# Patient Record
Sex: Female | Born: 1985 | Race: White | Hispanic: No | Marital: Single | Smoking: Never smoker
Health system: Southern US, Community
[De-identification: ages and names within clinical notes are randomized; demographics above are authoritative.]

## PROBLEM LIST (undated history)

## (undated) DIAGNOSIS — O009 Unspecified ectopic pregnancy without intrauterine pregnancy: Secondary | ICD-10-CM

## (undated) HISTORY — PX: MYRINGOTOMY WITH TUBE PLACEMENT: SHX5663

## (undated) HISTORY — PX: WISDOM TOOTH EXTRACTION: SHX21

## (undated) HISTORY — DX: Unspecified ectopic pregnancy without intrauterine pregnancy: O00.90

---

## 2011-03-12 DIAGNOSIS — O009 Unspecified ectopic pregnancy without intrauterine pregnancy: Secondary | ICD-10-CM

## 2011-03-12 HISTORY — DX: Unspecified ectopic pregnancy without intrauterine pregnancy: O00.90

## 2011-03-12 HISTORY — PX: OTHER SURGICAL HISTORY: SHX169

## 2011-08-31 ENCOUNTER — Inpatient Hospital Stay: Payer: Self-pay

## 2011-08-31 LAB — CBC WITH DIFFERENTIAL/PLATELET
Basophil #: 0.1 10*3/uL (ref 0.0–0.1)
Eosinophil #: 0 10*3/uL (ref 0.0–0.7)
Eosinophil %: 0.4 %
HCT: 33 % — ABNORMAL LOW (ref 35.0–47.0)
HGB: 11 g/dL — ABNORMAL LOW (ref 12.0–16.0)
MCH: 30.2 pg (ref 26.0–34.0)
MCHC: 33.5 g/dL (ref 32.0–36.0)
MCV: 90 fL (ref 80–100)
Monocyte %: 5.9 %
Neutrophil #: 8.7 10*3/uL — ABNORMAL HIGH (ref 1.4–6.5)
Neutrophil %: 77.9 %
Platelet: 257 10*3/uL (ref 150–440)
RBC: 3.66 10*6/uL — ABNORMAL LOW (ref 3.80–5.20)
RDW: 13.4 % (ref 11.5–14.5)
WBC: 11.2 10*3/uL — ABNORMAL HIGH (ref 3.6–11.0)

## 2011-09-01 LAB — HEMATOCRIT: HCT: 31.2 % — ABNORMAL LOW (ref 35.0–47.0)

## 2012-01-11 LAB — CBC
MCHC: 33.9 g/dL (ref 32.0–36.0)
MCV: 87 fL (ref 80–100)
Platelet: 425 10*3/uL (ref 150–440)
RBC: 4.39 10*6/uL (ref 3.80–5.20)
RDW: 13.2 % (ref 11.5–14.5)
WBC: 9.7 10*3/uL (ref 3.6–11.0)

## 2012-01-11 LAB — COMPREHENSIVE METABOLIC PANEL WITH GFR
Albumin: 3.7 g/dL
Alkaline Phosphatase: 94 U/L
Anion Gap: 8
BUN: 11 mg/dL
Bilirubin,Total: 0.2 mg/dL
Calcium, Total: 9.1 mg/dL
Chloride: 103 mmol/L
Co2: 28 mmol/L
Creatinine: 0.61 mg/dL
EGFR (African American): >60
EGFR (Non-African Amer.): >60
Glucose: 100 mg/dL — ABNORMAL HIGH
Osmolality: 277
Potassium: 4 mmol/L
SGOT(AST): 13 U/L — ABNORMAL LOW
SGPT (ALT): 20 U/L
Sodium: 139 mmol/L
Total Protein: 8.2 g/dL

## 2012-01-11 LAB — URINALYSIS, COMPLETE
Bacteria: NONE SEEN
Bilirubin,UR: NEGATIVE
Glucose,UR: NEGATIVE mg/dL (ref 0–75)
Ketone: NEGATIVE
Nitrite: NEGATIVE
Specific Gravity: 1.024 (ref 1.003–1.030)
Squamous Epithelial: 1

## 2012-01-11 LAB — HCG, QUANTITATIVE, PREGNANCY: Beta Hcg, Quant.: 9130 m[IU]/mL — ABNORMAL HIGH

## 2012-01-12 ENCOUNTER — Observation Stay: Payer: Self-pay | Admitting: Obstetrics & Gynecology

## 2013-12-21 ENCOUNTER — Inpatient Hospital Stay: Payer: Self-pay | Admitting: Obstetrics and Gynecology

## 2013-12-21 LAB — CBC WITH DIFFERENTIAL/PLATELET
Basophil #: 0 10*3/uL (ref 0.0–0.1)
Basophil %: 0.4 %
EOS PCT: 0.4 %
Eosinophil #: 0.1 10*3/uL (ref 0.0–0.7)
HCT: 36 % (ref 35.0–47.0)
HGB: 11.6 g/dL — ABNORMAL LOW (ref 12.0–16.0)
LYMPHS ABS: 2.1 10*3/uL (ref 1.0–3.6)
LYMPHS PCT: 18.5 %
MCH: 28.3 pg (ref 26.0–34.0)
MCHC: 32.3 g/dL (ref 32.0–36.0)
MCV: 88 fL (ref 80–100)
Monocyte #: 0.4 x10 3/mm (ref 0.2–0.9)
Monocyte %: 3.9 %
NEUTROS ABS: 8.8 10*3/uL — AB (ref 1.4–6.5)
NEUTROS PCT: 76.8 %
PLATELETS: 295 10*3/uL (ref 150–440)
RBC: 4.1 10*6/uL (ref 3.80–5.20)
RDW: 14 % (ref 11.5–14.5)
WBC: 11.4 10*3/uL — ABNORMAL HIGH (ref 3.6–11.0)

## 2013-12-21 LAB — PROTEIN / CREATININE RATIO, URINE
Creatinine, Urine: 158 mg/dL — ABNORMAL HIGH (ref 30.0–125.0)
PROTEIN, RANDOM URINE: 22 mg/dL — AB (ref 0–12)
Protein/Creat. Ratio: 139 mg/gCREAT (ref 0–200)

## 2013-12-22 LAB — COMPREHENSIVE METABOLIC PANEL
ALBUMIN: 2.3 g/dL — AB (ref 3.4–5.0)
AST: 20 U/L (ref 15–37)
Alkaline Phosphatase: 201 U/L — ABNORMAL HIGH
Anion Gap: 9 (ref 7–16)
BUN: 7 mg/dL (ref 7–18)
Bilirubin,Total: 0.2 mg/dL (ref 0.2–1.0)
Calcium, Total: 9 mg/dL (ref 8.5–10.1)
Chloride: 106 mmol/L (ref 98–107)
Co2: 26 mmol/L (ref 21–32)
Creatinine: 0.63 mg/dL (ref 0.60–1.30)
EGFR (African American): >60
EGFR (Non-African Amer.): >60
Glucose: 72 mg/dL (ref 65–99)
Osmolality: 278 (ref 275–301)
Potassium: 4.1 mmol/L (ref 3.5–5.1)
SGPT (ALT): 19 U/L
SODIUM: 141 mmol/L (ref 136–145)
Total Protein: 6.2 g/dL — ABNORMAL LOW (ref 6.4–8.2)

## 2013-12-23 LAB — HEMATOCRIT: HCT: 32.6 % — ABNORMAL LOW (ref 35.0–47.0)

## 2014-06-28 NOTE — Op Note (Signed)
PATIENT NAME:  Diana Bautista, Diana Bautista MR#:  [id number] DATE OF BIRTH:  09-23-1985  DATE OF PROCEDURE:  01/12/2012  PREOPERATIVE DIAGNOSIS: Left ectopic pregnancy.   POSTOPERATIVE DIAGNOSIS: Left ectopic pregnancy.   PROCEDURE PERFORMED: Operative laparoscopy with left salpingectomy.   SURGEON: Glean Salen, MD  ANESTHESIA: General.   ESTIMATED BLOOD LOSS: 50 mL.   COMPLICATIONS: None.   FINDINGS: Left ectopic pregnancy in the ampullary portion of fallopian tube that is damaged beyond repair for salpingostomy. A salpingectomy was performed. Normal right fallopian tube as well as normal uterus and ovaries.   DISPOSITION: To recovery room in stable condition.   TECHNIQUE: Patient is prepped and draped in the usual sterile fashion after adequate anesthesia is obtained in the dorsal lithotomy position. Sponge stick is placed per vagina for manipulation purposes and the bladder is drained with a Robinson catheter.   Attention is then turned to the abdomen where a Veress needle is inserted through a 5 mm infraumbilical incision after Marcaine is used to anesthetize the skin. Veress needle placement is confirmed using the hanging drop technique and the abdomen is then insufflated with CO2 gas. A 5 mm trocar is then inserted under direct visualization with the laparoscope with no injuries or bleeding noted. Patient is placed in Trendelenburg positioning and the above-mentioned findings are visualized.   A right lower quadrant 5 mm trocar and a suprapubic 11 mm trocar is placed for instrumentation purposes. The fallopian tube is noted to be significantly damaged and is excised using a 5 mm Harmonic scalpel with preservation of the blood supply to the ovary. It is placed in an Endopouch and removed. Excellent hemostasis noted. Pelvis is irrigated with aspiration of all fluid and blood clot. Gas is expelled and patient is leveled. Trocars are removed and skin is closed with Dermabond. Sponge stick is removed.  Patient goes to recovery in stable condition. All sponge, instrument, and needle counts are correct.   ____________________________ R. Barnett Applebaum, MD rph:cms D: 01/12/2012 19:17:23 ET T: 01/13/2012 09:51:06 ET JOB#: [id number]  cc: Glean Salen, MD, <Dictator>  Gae Dry MD ELECTRONICALLY SIGNED 01/14/2012 7:47

## 2014-07-19 NOTE — H&P (Signed)
L&D Evaluation:  History Expanded:   HPI 29 yo G1 whose EDC - 09/07/11.  Pt presents with SROM at 5:30 this am.    Blood Type A positive    Group B Strep Results (Result >5wks must be treated as unknown) negative    Maternal HIV Negative    Maternal Syphilis Ab Nonreactive    Maternal Varicella Immune    Rubella Results immune    Presents with leaking fluid    Patient's Medical History No Chronic Illness    Patient's Surgical History none    Medications Pre Natal Vitamins    Allergies PCN    Social History none   Exam:   Vital Signs stable    General no apparent distress    Chest clear    Heart normal sinus rhythm    Abdomen gravid, non-tender    Pelvic 2/70/-2    Mebranes Ruptured    Description clear    FHT normal rate with no decels   Impression:   Impression PPROM   Plan:   Comments Pt has been fully informed of the pros and cons, risk/benefits continued close observation versus the risks of induction. She understands that there are uncommon risks to induction, which include but are not limited to:  frequent and/or prolonged uterine contractions, fetal distress, uterine rupture and lack of success of induction.  She also has been informed that if the induction is not successful a Cesarean Section may be necessary.  All questions have been answered and she is in agreement with induction.   Electronic Signatures: Rosina Lowenstein (MD)  (Signed 22-Jun-13 09:39)  Authored: L&D Evaluation   Last Updated: 22-Jun-13 09:39 by Rosina Lowenstein (MD)

## 2015-10-10 ENCOUNTER — Other Ambulatory Visit: Payer: Self-pay | Admitting: Obstetrics and Gynecology

## 2015-10-10 DIAGNOSIS — M7989 Other specified soft tissue disorders: Secondary | ICD-10-CM

## 2015-10-25 ENCOUNTER — Ambulatory Visit: Payer: Self-pay

## 2015-11-08 ENCOUNTER — Other Ambulatory Visit: Payer: Self-pay | Admitting: Obstetrics and Gynecology

## 2015-11-08 ENCOUNTER — Ambulatory Visit
Admission: RE | Admit: 2015-11-08 | Discharge: 2015-11-08 | Disposition: A | Discharge status: Home / Self Care | Payer: Medicaid Other | Source: Ambulatory Visit | Attending: Obstetrics and Gynecology | Admitting: Obstetrics and Gynecology

## 2015-11-08 DIAGNOSIS — M7989 Other specified soft tissue disorders: Secondary | ICD-10-CM

## 2017-01-15 ENCOUNTER — Encounter: Payer: Self-pay | Admitting: Obstetrics and Gynecology

## 2017-01-15 ENCOUNTER — Ambulatory Visit (INDEPENDENT_AMBULATORY_CARE_PROVIDER_SITE_OTHER): Payer: Medicaid Other | Admitting: Obstetrics and Gynecology

## 2017-01-15 DIAGNOSIS — Z0000 Encounter for general adult medical examination without abnormal findings: Secondary | ICD-10-CM | POA: Diagnosis not present

## 2017-01-15 DIAGNOSIS — Z01419 Encounter for gynecological examination (general) (routine) without abnormal findings: Secondary | ICD-10-CM | POA: Diagnosis not present

## 2017-01-15 DIAGNOSIS — Z124 Encounter for screening for malignant neoplasm of cervix: Secondary | ICD-10-CM

## 2017-01-15 NOTE — Progress Notes (Signed)
Patient ID: Diana Bautista, female   DOB: Aug 28, 1985, 31 y.o.   MRN: [id number]     Gynecology Annual Exam  PCP: Patient, No Pcp Per  Chief Complaint:  Chief Complaint  Patient presents with  . Gynecologic Exam    History of Present Illness: Patient is a 31 y.o. [id number] presents for annual exam. The patient has no complaints today.   LMP: No LMP recorded. Patient is not currently having periods (Reason: IUD).  The patient is sexually active. She currently uses IUD for contraception. She denies dyspareunia.  The patient does perform self breast exams.  There is no notable family history of breast or ovarian cancer in her family.  The patient wears seatbelts: yes.   The patient has regular exercise: not asked.    The patient denies current symptoms of depression.    Review of Systems: Review of Systems  Constitutional: Negative for chills and fever.  HENT: Negative for congestion.   Respiratory: Negative for cough and shortness of breath.   Cardiovascular: Negative for chest pain and palpitations.  Gastrointestinal: Negative for abdominal pain, constipation, diarrhea, heartburn, nausea and vomiting.  Genitourinary: Negative for dysuria, frequency and urgency.  Skin: Negative for itching and rash.  Neurological: Negative for dizziness and headaches.  Endo/Heme/Allergies: Negative for polydipsia.  Psychiatric/Behavioral: Negative for depression.    Past Medical History:  Past Medical History:  Diagnosis Date  . Ectopic pregnancy 2013    Past Surgical History:  Past Surgical History:  Procedure Laterality Date  . left salpingectomy  2013   Ectopic pregnancy    Gynecologic History:  No LMP recorded. Patient is not currently having periods (Reason: IUD). Contraception: IUD 02/15/2014 Liletta Last Pap: Results were:10/09/2015 NIL and HR HPV negative   Obstetric History: [id number]  Family History:  Family History  Problem Relation Age of Onset  . Hodgkin's lymphoma  Paternal Grandmother     Social History:  Social History   Socioeconomic History  . Marital status: Single    Spouse name: Not on file  . Number of children: Not on file  . Years of education: Not on file  . Highest education level: Not on file  Social Needs  . Financial resource strain: Not on file  . Food insecurity - worry: Not on file  . Food insecurity - inability: Not on file  . Transportation needs - medical: Not on file  . Transportation needs - non-medical: Not on file  Occupational History  . Not on file  Tobacco Use  . Smoking status: Never Smoker  . Smokeless tobacco: Never Used  Substance and Sexual Activity  . Alcohol use: No    Frequency: Never  . Drug use: No  . Sexual activity: Yes    Partners: Male    Birth control/protection: IUD  Other Topics Concern  . Not on file  Social History Narrative  . Not on file    Allergies:  Allergies  Allergen Reactions  . Penicillins Hives    Medications: Prior to Admission medications   Medication Sig Start Date End Date Taking? Authorizing Provider  Levonorgestrel (LILETTA, 52 MG,) 19.5 MCG/DAY IUD by Intrauterine route.   Yes [provider]    Physical Exam Vitals: Blood pressure 130/82, pulse (!) 107, height 5\' 3"  (1.6 m), weight 273 lb (123.8 kg).  General: NAD HEENT: normocephalic, anicteric Thyroid: no enlargement, no palpable nodules Pulmonary: No increased work of breathing, CTAB Cardiovascular: RRR, distal pulses 2+ Breast: Breast symmetrical, no tenderness, no palpable nodules  or masses, no skin or nipple retraction present, no nipple discharge.  No axillary or supraclavicular lymphadenopathy. Abdomen: NABS, soft, non-tender, non-distended.  Umbilicus without lesions.  No hepatomegaly, splenomegaly or masses palpable. No evidence of hernia  Genitourinary:  External: Normal external female genitalia.  Normal urethral meatus, normal  Bartholin's and Skene's glands.    Vagina: Normal  vaginal mucosa, no evidence of prolapse.    Cervix: Grossly normal in appearance, no bleeding  Uterus: Non-enlarged, mobile, normal contour.  No CMT  Adnexa: ovaries non-enlarged, no adnexal masses  Rectal: deferred  Lymphatic: no evidence of inguinal lymphadenopathy Extremities: no edema, erythema, or tenderness Neurologic: Grossly intact Psychiatric: mood appropriate, affect full  Female chaperone present for pelvic and breast  portions of the physical exam    Assessment: 31 y.o. [id number] routine annual exam  Plan: Problem List Items Addressed This Visit    None    Visit Diagnoses    Screening for malignant neoplasm of cervix       Relevant Orders   PapIG, HPV, rfx 16/18   Encounter for gynecological examination without abnormal finding       Relevant Orders   PapIG, HPV, rfx 16/18      1) STI screening was not offered  2) ASCCP guidelines and rational discussed.  Patient opts for yearly screening interval  3) Contraception - Continue Liletta, discussed recent approval of 5 year duration  4) Routine healthcare maintenance including cholesterol, diabetes screening discussed Declines  5) Follow up 1 year for routine annual exam

## 2017-01-15 NOTE — Patient Instructions (Signed)
Preventive Care 18-39 Years, Female Preventive care refers to lifestyle choices and visits with your health care provider that can promote health and wellness. What does preventive care include?  A yearly physical exam. This is also called an annual well check.  Dental exams once or twice a year.  Routine eye exams. Ask your health care provider how often you should have your eyes checked.  Personal lifestyle choices, including: ? Daily care of your teeth and gums. ? Regular physical activity. ? Eating a healthy diet. ? Avoiding tobacco and drug use. ? Limiting alcohol use. ? Practicing safe sex. ? Taking vitamin and mineral supplements as recommended by your health care provider. What happens during an annual well check? The services and screenings done by your health care provider during your annual well check will depend on your age, overall health, lifestyle risk factors, and family history of disease. Counseling Your health care provider may ask you questions about your:  Alcohol use.  Tobacco use.  Drug use.  Emotional well-being.  Home and relationship well-being.  Sexual activity.  Eating habits.  Work and work Statistician.  Method of birth control.  Menstrual cycle.  Pregnancy history.  Screening You may have the following tests or measurements:  Height, weight, and BMI.  Diabetes screening. This is done by checking your blood sugar (glucose) after you have not eaten for a while (fasting).  Blood pressure.  Lipid and cholesterol levels. These may be checked every 5 years starting at age 66.  Skin check.  Hepatitis C blood test.  Hepatitis B blood test.  Sexually transmitted disease (STD) testing.  BRCA-related cancer screening. This may be done if you have a family history of breast, ovarian, tubal, or peritoneal cancers.  Pelvic exam and Pap test. This may be done every 3 years starting at age 40. Starting at age 59, this may be done every 5  years if you have a Pap test in combination with an HPV test.  Discuss your test results, treatment options, and if necessary, the need for more tests with your health care provider. Vaccines Your health care provider may recommend certain vaccines, such as:  Influenza vaccine. This is recommended every year.  Tetanus, diphtheria, and acellular pertussis (Tdap, Td) vaccine. You may need a Td booster every 10 years.  Varicella vaccine. You may need this if you have not been vaccinated.  HPV vaccine. If you are 69 or younger, you may need three doses over 6 months.  Measles, mumps, and rubella (MMR) vaccine. You may need at least one dose of MMR. You may also need a second dose.  Pneumococcal 13-valent conjugate (PCV13) vaccine. You may need this if you have certain conditions and were not previously vaccinated.  Pneumococcal polysaccharide (PPSV23) vaccine. You may need one or two doses if you smoke cigarettes or if you have certain conditions.  Meningococcal vaccine. One dose is recommended if you are age 27-21 years and a first-year college student living in a residence hall, or if you have one of several medical conditions. You may also need additional booster doses.  Hepatitis A vaccine. You may need this if you have certain conditions or if you travel or work in places where you may be exposed to hepatitis A.  Hepatitis B vaccine. You may need this if you have certain conditions or if you travel or work in places where you may be exposed to hepatitis B.  Haemophilus influenzae type b (Hib) vaccine. You may need this if  you have certain risk factors.  Talk to your health care provider about which screenings and vaccines you need and how often you need them. This information is not intended to replace advice given to you by your health care provider. Make sure you discuss any questions you have with your health care provider. Document Released: 04/23/2001 Document Revised: 11/15/2015  Document Reviewed: 12/27/2014 Elsevier Interactive Patient Education  2017 [employer].

## 2017-01-17 LAB — PAPIG, HPV, RFX 16/18
HPV, HIGH-RISK: NEGATIVE
PAP SMEAR COMMENT: 0

## 2017-05-05 DIAGNOSIS — J101 Influenza due to other identified influenza virus with other respiratory manifestations: Secondary | ICD-10-CM | POA: Diagnosis not present

## 2017-05-05 DIAGNOSIS — R509 Fever, unspecified: Secondary | ICD-10-CM | POA: Diagnosis not present

## 2017-11-17 DIAGNOSIS — J029 Acute pharyngitis, unspecified: Secondary | ICD-10-CM | POA: Diagnosis not present

## 2017-11-22 ENCOUNTER — Other Ambulatory Visit: Payer: Self-pay

## 2017-11-22 ENCOUNTER — Emergency Department
Admission: EM | Admit: 2017-11-22 | Discharge: 2017-11-22 | Disposition: A | Discharge status: Home / Self Care | Payer: Medicaid Other | Attending: Emergency Medicine | Admitting: Emergency Medicine

## 2017-11-22 ENCOUNTER — Encounter: Payer: Self-pay | Admitting: Physician Assistant

## 2017-11-22 DIAGNOSIS — B9789 Other viral agents as the cause of diseases classified elsewhere: Secondary | ICD-10-CM | POA: Diagnosis not present

## 2017-11-22 DIAGNOSIS — M26621 Arthralgia of right temporomandibular joint: Secondary | ICD-10-CM | POA: Insufficient documentation

## 2017-11-22 DIAGNOSIS — J029 Acute pharyngitis, unspecified: Secondary | ICD-10-CM | POA: Diagnosis not present

## 2017-11-22 DIAGNOSIS — J028 Acute pharyngitis due to other specified organisms: Secondary | ICD-10-CM

## 2017-11-22 LAB — GROUP A STREP BY PCR: Group A Strep by PCR: NOT DETECTED

## 2017-11-22 NOTE — ED Notes (Addendum)
Pt presents to ED via POV with c/o R sided facial pain x several days. Pt states initially started out as a sore throat then progressed to R sided facial pain, pt states pain to R cheek that radiates to her tongue, worse with tongue movement. Pt A&O x 4, NAD noted at this time. Facial symmetry intact.

## 2017-11-22 NOTE — ED Triage Notes (Signed)
Pt states has right sided facial pain. Pt states pain began with a sore throat, that has abated and is now localized to right sided jaw pain extending to right ear. Pt was swabbed for strep in triage. Tonsils are slightly enlarged.

## 2017-11-22 NOTE — Discharge Instructions (Addendum)
Your strep test was negative. Your exam is also, otherwise normal. Your symptoms may represent a mild TMJ dysfunction or dental pain due to jaw clinching. Consider a follow-up appointment with your dental provider. Take OTC Naproxen (550 mg) or ibuprofen (800 mg) for pain relief. Return to the Emergency Department as needed.

## 2017-11-24 NOTE — ED Provider Notes (Signed)
[facility] Emergency Department Provider Note ____________________________________________  Time seen: 2100  I have reviewed the triage vital signs and the nursing notes.  HISTORY  Chief Complaint  Facial Pain  HPI Diana Bautista is a 32 y.o. female who presents herself to the ED for evaluation of pain to the right face and jaw over the last week.  Patient describes pain began initially as a mild sore throat.  She describes a sore throat has now resolved and the pain is localized primarily to the right jaw.  She cannot pinpoint the exact location of the pain patient describes pressure and fullness to the jaw.  She does give a remote history of jaw clenching and teeth grinding.  She has also been evaluated by a local urgent care, but reports that they did not perform a strep test or any further evaluation.  She presents now with vague complaints of jaw and facial discomfort.  She denies any fevers, chills, sweats patient also denies any difficulty breathing, swallowing, or controlling secretions.  She denies any recent dental work or any focal dental abscess.  She denies any sinus congestion, nasal drainage, earache, or nasal allergies.  She has been taking over-the-counter pseudoephedrine with limited benefit.  Past Medical History:  Diagnosis Date  . Ectopic pregnancy 2013    There are no active problems to display for this patient.   Past Surgical History:  Procedure Laterality Date  . left salpingectomy  2013   Ectopic pregnancy    Prior to Admission medications   Medication Sig Start Date End Date Taking? Authorizing Provider  Levonorgestrel (LILETTA, 52 MG,) 19.5 MCG/DAY IUD by Intrauterine route.    [provider]    Allergies Penicillins  Family History  Problem Relation Age of Onset  . Hodgkin's lymphoma Paternal Grandmother     Social History Social History   Tobacco Use  . Smoking status: Never Smoker  . Smokeless tobacco:  Never Used  Substance Use Topics  . Alcohol use: No    Frequency: Never  . Drug use: No    Review of Systems  Constitutional: Negative for fever. Eyes: Negative for visual changes. ENT: Positive for sore throat.  Reports right facial pain as above. Cardiovascular: Negative for chest pain. Respiratory: Negative for shortness of breath. Gastrointestinal: Negative for abdominal pain, vomiting and diarrhea. Genitourinary: Negative for dysuria. Musculoskeletal: Negative for back pain. Skin: Negative for rash. Neurological: Negative for headaches, focal weakness or numbness. ____________________________________________  PHYSICAL EXAM:  VITAL SIGNS: ED Triage Vitals [11/22/17 2001]  Enc Vitals Group     BP (!) 164/84     Pulse Rate 93     Resp 16     Temp 98.3 F (36.8 C)     Temp Source Oral     SpO2 100 %     Weight 262 lb (118.8 kg)     Height 5\' 3"  (1.6 m)     Head Circumference      Peak Flow      Pain Score 5     Pain Loc      Pain Edu?      Excl. in [place]?     Constitutional: Alert and oriented. Well appearing and in no distress. Head: Normocephalic and atraumatic.  No obvious deformity, soft tissue swelling, or facial edema is appreciated.  Patient is nontender to palpation or percussion over the facial sinuses.  No TMJ dysfunction is appreciated.  No malocclusion of the jaw noted. Eyes: Conjunctivae are  normal. PERRL. Normal extraocular movements Ears: Canals clear. TMs intact bilaterally. Nose: No congestion/rhinorrhea/epistaxis. Mouth/Throat: Mucous membranes are moist.  Uvula is midline and tonsils are flat.  No oropharyngeal lesions are noted. Neck: Supple. No thyromegaly. Hematological/Lymphatic/Immunological: No cervical lymphadenopathy. Cardiovascular: Normal rate, regular rhythm. Normal distal pulses. Respiratory: Normal respiratory effort. No wheezes/rales/rhonchi. Musculoskeletal: Nontender with normal range of motion in all extremities.  Neurologic:   Normal gait without ataxia. Normal speech and language. No gross focal neurologic deficits are appreciated. Skin:  Skin is warm, dry and intact. No rash noted. Psychiatric: Mood and affect are normal. Patient exhibits appropriate insight and judgment. ____________________________________________   LABS (pertinent positives/negatives)  Labs Reviewed  GROUP A STREP BY PCR  ____________________________________________  PROCEDURES  Procedures ____________________________________________  INITIAL IMPRESSION / ASSESSMENT AND PLAN / ED COURSE  Patient with ED evaluation of intermittent sore throat as well as some discomfort to the right cheek and jaw.  Patient's exam is overall benign.  No focal dental abscess is noted.  She is reassured by her negative strep PCR test.  Patient symptoms may represent some TMJ dysfunction on the right.  Her sore throat symptoms while mild, and intermittent, may be viral and/or allergic in nature.  Patient is advised to follow with her dental provider for further evaluation and management. ____________________________________________  FINAL CLINICAL IMPRESSION(S) / ED DIAGNOSES  Final diagnoses:  Arthralgia of right temporomandibular joint  Sore throat (viral)      Carmie End, Dannielle Karvonen, PA-C 11/24/17 [place], [place], MD 11/28/17 2332

## 2017-12-05 ENCOUNTER — Other Ambulatory Visit (HOSPITAL_COMMUNITY)
Admission: RE | Admit: 2017-12-05 | Discharge: 2017-12-05 | Disposition: A | Discharge status: Home / Self Care | Payer: Medicaid Other | Source: Ambulatory Visit | Attending: Maternal Newborn | Admitting: Maternal Newborn

## 2017-12-05 ENCOUNTER — Ambulatory Visit: Payer: Medicaid Other | Admitting: Maternal Newborn

## 2017-12-05 ENCOUNTER — Encounter: Payer: Self-pay | Admitting: Maternal Newborn

## 2017-12-05 ENCOUNTER — Ambulatory Visit (INDEPENDENT_AMBULATORY_CARE_PROVIDER_SITE_OTHER): Payer: Medicaid Other | Admitting: Maternal Newborn

## 2017-12-05 VITALS — BP 124/86 | HR 105 | Ht 63.0 in | Wt 264.0 lb

## 2017-12-05 DIAGNOSIS — Z30431 Encounter for routine checking of intrauterine contraceptive device: Secondary | ICD-10-CM

## 2017-12-05 DIAGNOSIS — N898 Other specified noninflammatory disorders of vagina: Secondary | ICD-10-CM | POA: Insufficient documentation

## 2017-12-05 NOTE — Progress Notes (Signed)
Obstetrics & Gynecology Office Visit   Chief Complaint:  Chief Complaint  Patient presents with  . IUD check    no pain, just discomfort, feels like its swollen in the area x a few days    History of Present Illness: Diana Bautista reports vaginal/vulvar irritation and a feeling of external swelling for a couple of days. She does not report abnormal discharge or odor. She is concerned about whether her symptoms could be connected to her IUD and whether it is in the correct location.  Review of Systems: Review of systems negative unless otherwise noted in HPI.  Past Medical History:  Past Medical History:  Diagnosis Date  . Ectopic pregnancy 2013    Past Surgical History:  Past Surgical History:  Procedure Laterality Date  . left salpingectomy  2013   Ectopic pregnancy    Gynecologic History: No LMP recorded. (Menstrual status: IUD).  Obstetric History: [id number]  Family History:  Family History  Problem Relation Age of Onset  . Hodgkin's lymphoma Paternal Grandmother     Social History:  Social History   Socioeconomic History  . Marital status: Single    Spouse name: Not on file  . Number of children: Not on file  . Years of education: Not on file  . Highest education level: Not on file  Occupational History  . Not on file  Social Needs  . Financial resource strain: Not on file  . Food insecurity:    Worry: Not on file    Inability: Not on file  . Transportation needs:    Medical: Not on file    Non-medical: Not on file  Tobacco Use  . Smoking status: Never Smoker  . Smokeless tobacco: Never Used  Substance and Sexual Activity  . Alcohol use: No    Frequency: Never  . Drug use: No  . Sexual activity: Yes    Partners: Male    Birth control/protection: IUD    Comment: Lyletta  Lifestyle  . Physical activity:    Days per week: 7 days    Minutes per session: 30 min  . Stress: Not at all  Relationships  . Social connections:    Talks on phone: More than  three times a week    Gets together: Three times a week    Attends religious service: Never    Active member of club or organization: No    Attends meetings of clubs or organizations: Never    Relationship status: Never married  . Intimate partner violence:    Fear of current or ex partner: No    Emotionally abused: No    Physically abused: No    Forced sexual activity: No  Other Topics Concern  . Not on file  Social History Narrative  . Not on file    Allergies:  Allergies  Allergen Reactions  . Penicillins Hives    Medications: Prior to Admission medications   Medication Sig Start Date End Date Taking? Authorizing Provider  Levonorgestrel (LILETTA, 52 MG,) 19.5 MCG/DAY IUD by Intrauterine route.   Yes [provider]    Physical Exam Vitals:  Vitals:   12/05/17 1353  BP: 124/86  Pulse: (!) 105   No LMP recorded. (Menstrual status: IUD).  General: NAD HEENT: normocephalic, anicteric Pulmonary: No increased work of breathing Genitourinary:  External: Vulva appears irritated  Normal urethral meatus,  normal Bartholin's and Skene's glands.    Vagina: Normal vaginal mucosa, no evidence of prolapse.    Cervix: Grossly  normal in appearance, no bleeding, IUD  strings visible, white discharge present, small amount  Rectal: deferred  Lymphatic: no evidence of inguinal lymphadenopathy Extremities: no edema, erythema, or tenderness Neurologic: Grossly intact Psychiatric: mood appropriate, affect full  Assessment: 32 y.o. [id number] here for an IUD check and vaginal irritation.  Plan: Problem List Items Addressed This Visit    None    Visit Diagnoses    Vaginal irritation    -  Primary   Relevant Orders   Cervicovaginal ancillary only   Encounter for routine checking of intrauterine contraceptive device (IUD)         IUD appears to be in correct location, strings visualized. Swab sent for symptoms of irritation/swelling and will treat as needed when results  return.  Avel Sensor, CNM 12/05/2017  2:24 PM

## 2017-12-09 ENCOUNTER — Ambulatory Visit: Payer: Medicaid Other | Admitting: Obstetrics and Gynecology

## 2017-12-09 ENCOUNTER — Other Ambulatory Visit: Payer: Self-pay | Admitting: Maternal Newborn

## 2017-12-09 DIAGNOSIS — B373 Candidiasis of vulva and vagina: Secondary | ICD-10-CM

## 2017-12-09 DIAGNOSIS — B3731 Acute candidiasis of vulva and vagina: Secondary | ICD-10-CM

## 2017-12-09 DIAGNOSIS — N760 Acute vaginitis: Secondary | ICD-10-CM

## 2017-12-09 DIAGNOSIS — B9689 Other specified bacterial agents as the cause of diseases classified elsewhere: Secondary | ICD-10-CM

## 2017-12-09 LAB — CERVICOVAGINAL ANCILLARY ONLY
BACTERIAL VAGINITIS: POSITIVE — AB
Candida vaginitis: POSITIVE — AB

## 2017-12-09 MED ORDER — FLUCONAZOLE 150 MG PO TABS: 150.0000 mg | ORAL_TABLET | Freq: Once | ORAL | 0 refills | Status: AC

## 2017-12-09 MED ORDER — SECNIDAZOLE 2 G PO PACK: 1.0000 | PACK | Freq: Once | ORAL | 0 refills | Status: AC

## 2017-12-09 NOTE — Progress Notes (Signed)
Sent Rx for Diflucan to treat yeast infection and Solosec for BV. She will call for alternative (Flagyl) if Solosec is not affordable.

## 2017-12-11 ENCOUNTER — Other Ambulatory Visit: Payer: Self-pay | Admitting: Maternal Newborn

## 2017-12-11 ENCOUNTER — Telehealth: Payer: Self-pay

## 2017-12-11 DIAGNOSIS — N760 Acute vaginitis: Principal | ICD-10-CM

## 2017-12-11 DIAGNOSIS — B9689 Other specified bacterial agents as the cause of diseases classified elsewhere: Secondary | ICD-10-CM

## 2017-12-11 MED ORDER — METRONIDAZOLE 500 MG PO TABS: 500.0000 mg | ORAL_TABLET | Freq: Two times a day (BID) | ORAL | 0 refills | Status: AC

## 2017-12-11 NOTE — Telephone Encounter (Signed)
Pt was seen last Fri.; one of the medications rx'd wasn't approved which [facility] said it might not be and if so to call and she would call in an alternative.  [phone]

## 2017-12-11 NOTE — Progress Notes (Signed)
RX for Flagyl as Solosec not affordable.

## 2017-12-11 NOTE — Telephone Encounter (Signed)
Pt has already picked it up

## 2017-12-11 NOTE — Telephone Encounter (Signed)
Flagyl sent to pharmacy, please let me know if she has any more problems with Rx. Thanks.

## 2017-12-12 ENCOUNTER — Encounter: Payer: Self-pay | Admitting: Maternal Newborn

## 2018-03-23 DIAGNOSIS — J069 Acute upper respiratory infection, unspecified: Secondary | ICD-10-CM | POA: Diagnosis not present

## 2018-05-11 ENCOUNTER — Ambulatory Visit: Payer: Medicaid Other | Admitting: Obstetrics and Gynecology

## 2018-05-18 NOTE — Progress Notes (Signed)
PCP:  [facility]   Chief Complaint  Patient presents with  . Gynecologic Exam     HPI:      Ms. Diana Bautista is a 33 y.o. [id number] who LMP was No LMP recorded. (Menstrual status: IUD)., presents today for her annual examination.  Her menses are light bleeding with wiping only about once a month, with IUD. Dysmenorrhea none. She does not have intermenstrual bleeding.  Sex activity: single partner, contraception - IUD. Liletta placed 02/15/14. Last Pap: January 15, 2017  Results were: no abnormalities /neg HPV DNA. Likes yearly paps, no hx of abn. Hx of STDs: none  There is no FH of breast cancer. There is no FH of ovarian cancer. The patient does do self-breast exams. Hx of fatty tissue LT axilla 2017 on mammo/u/s.  Tobacco use: The patient denies current or previous tobacco use. Alcohol use: none No drug use.  Exercise: min active  She does get adequate calcium but Vitamin D in her diet. Seeing new PCP in a couple wks, will do labs there.  Past Medical History:  Diagnosis Date  . Ectopic pregnancy 2013    Past Surgical History:  Procedure Laterality Date  . left salpingectomy  2013   Ectopic pregnancy    Family History  Problem Relation Age of Onset  . Hodgkin's lymphoma Paternal Grandmother   . Breast cancer Neg Hx   . Ovarian cancer Neg Hx     Social History   Socioeconomic History  . Marital status: Single    Spouse name: Not on file  . Number of children: Not on file  . Years of education: Not on file  . Highest education level: Not on file  Occupational History  . Not on file  Social Needs  . Financial resource strain: Not on file  . Food insecurity:    Worry: Not on file    Inability: Not on file  . Transportation needs:    Medical: Not on file    Non-medical: Not on file  Tobacco Use  . Smoking status: Never Smoker  . Smokeless tobacco: Never Used  Substance and Sexual Activity  . Alcohol use: No    Frequency: Never  . Drug  use: No  . Sexual activity: Yes    Partners: Male    Birth control/protection: I.U.D.    Comment: Lyletta  Lifestyle  . Physical activity:    Days per week: 7 days    Minutes per session: 30 min  . Stress: Not at all  Relationships  . Social connections:    Talks on phone: More than three times a week    Gets together: Three times a week    Attends religious service: Never    Active member of club or organization: No    Attends meetings of clubs or organizations: Never    Relationship status: Never married  . Intimate partner violence:    Fear of current or ex partner: No    Emotionally abused: No    Physically abused: No    Forced sexual activity: No  Other Topics Concern  . Not on file  Social History Narrative  . Not on file    Outpatient Medications Prior to Visit  Medication Sig Dispense Refill  . ibuprofen (ADVIL,MOTRIN) 800 MG tablet TK 1 T PO Q 6 TO 8 H PRN FOR PAIN    . Levonorgestrel (LILETTA, 52 MG,) 19.5 MCG/DAY IUD by Intrauterine route.     No facility-administered  medications prior to visit.    ROS:  Review of Systems  Constitutional: Negative for fatigue, fever and unexpected weight change.  Respiratory: Negative for cough, shortness of breath and wheezing.   Cardiovascular: Negative for chest pain, palpitations and leg swelling.  Gastrointestinal: Negative for blood in stool, constipation, diarrhea, nausea and vomiting.  Endocrine: Negative for cold intolerance, heat intolerance and polyuria.  Genitourinary: Negative for dyspareunia, dysuria, flank pain, frequency, genital sores, hematuria, menstrual problem, pelvic pain, urgency, vaginal bleeding, vaginal discharge and vaginal pain.  Musculoskeletal: Negative for back pain, joint swelling and myalgias.  Skin: Negative for rash.  Neurological: Positive for headaches. Negative for dizziness, syncope, light-headedness and numbness.  Hematological: Negative for adenopathy.  Psychiatric/Behavioral:  Negative for agitation, confusion, sleep disturbance and suicidal ideas. The patient is not nervous/anxious.    BREAST: No symptoms   Objective: BP 120/80   Pulse 90   Ht 5\' 3"  (1.6 m)   Wt 277 lb (125.6 kg)   BMI 49.07 kg/m    Physical Exam Constitutional:      Appearance: She is well-developed.  Genitourinary:     Vulva, vagina, cervix, uterus, right adnexa and left adnexa normal.     No vulval lesion or tenderness noted.     No vaginal discharge, erythema or tenderness.     No cervical polyp.     IUD strings visualized.     Uterus is not enlarged or tender.     No right or left adnexal mass present.     Right adnexa not tender.     Left adnexa not tender.     Genitourinary Comments: IUD STRINGS IN CX OS  Neck:     Musculoskeletal: Normal range of motion.     Thyroid: No thyromegaly.  Cardiovascular:     Rate and Rhythm: Normal rate and regular rhythm.     Heart sounds: Normal heart sounds. No murmur.  Pulmonary:     Effort: Pulmonary effort is normal.     Breath sounds: Normal breath sounds.  Chest:     Breasts:        Right: No mass, nipple discharge, skin change or tenderness.        Left: No mass, nipple discharge, skin change or tenderness.  Abdominal:     Palpations: Abdomen is soft.     Tenderness: There is no abdominal tenderness. There is no guarding.  Musculoskeletal: Normal range of motion.  Neurological:     General: No focal deficit present.     Mental Status: She is alert and oriented to person, place, and time.     Cranial Nerves: No cranial nerve deficit.  Skin:    General: Skin is warm and dry.  Psychiatric:        Mood and Affect: Mood normal.        Behavior: Behavior normal.        Thought Content: Thought content normal.        Judgment: Judgment normal.  Vitals signs reviewed.     Assessment/Plan: Encounter for annual routine gynecological examination  Cervical cancer screening - Plan: Cytology - PAP  Encounter for routine  checking of intrauterine contraceptive device (IUD) - IUD in place. Liletta indication is for 6 yrs now. F/u next yr.           GYN counsel adequate intake of calcium and vitamin D, diet and exercise     F/U  Return in about 1 year (around 05/19/2019).  Natali Lavallee B. Yitzhak Awan, PA-C  05/19/2018 8:37 AM

## 2018-05-18 NOTE — Patient Instructions (Signed)
I value your feedback and entrusting us with your care. If you get a [facility] patient survey, I would appreciate you taking the time to let us know about your experience today. Thank you! 

## 2018-05-19 ENCOUNTER — Encounter: Payer: Self-pay | Admitting: Obstetrics and Gynecology

## 2018-05-19 ENCOUNTER — Other Ambulatory Visit (HOSPITAL_COMMUNITY)
Admission: RE | Admit: 2018-05-19 | Discharge: 2018-05-19 | Disposition: A | Discharge status: Home / Self Care | Payer: Medicaid Other | Source: Ambulatory Visit | Attending: Obstetrics and Gynecology | Admitting: Obstetrics and Gynecology

## 2018-05-19 ENCOUNTER — Ambulatory Visit (INDEPENDENT_AMBULATORY_CARE_PROVIDER_SITE_OTHER): Payer: Medicaid Other | Admitting: Obstetrics and Gynecology

## 2018-05-19 VITALS — BP 120/80 | HR 90 | Ht 63.0 in | Wt 277.0 lb

## 2018-05-19 DIAGNOSIS — Z124 Encounter for screening for malignant neoplasm of cervix: Secondary | ICD-10-CM | POA: Insufficient documentation

## 2018-05-19 DIAGNOSIS — Z0000 Encounter for general adult medical examination without abnormal findings: Secondary | ICD-10-CM

## 2018-05-19 DIAGNOSIS — Z30431 Encounter for routine checking of intrauterine contraceptive device: Secondary | ICD-10-CM

## 2018-05-19 DIAGNOSIS — Z01419 Encounter for gynecological examination (general) (routine) without abnormal findings: Secondary | ICD-10-CM

## 2018-05-20 ENCOUNTER — Encounter: Payer: Self-pay | Admitting: Obstetrics and Gynecology

## 2018-05-20 LAB — CYTOLOGY - PAP: Diagnosis: NEGATIVE

## 2018-06-02 ENCOUNTER — Ambulatory Visit: Payer: Self-pay | Admitting: Physician Assistant

## 2018-11-12 ENCOUNTER — Encounter: Payer: Self-pay | Admitting: Physician Assistant

## 2018-11-12 ENCOUNTER — Ambulatory Visit (INDEPENDENT_AMBULATORY_CARE_PROVIDER_SITE_OTHER): Payer: Medicaid Other | Admitting: Physician Assistant

## 2018-11-12 ENCOUNTER — Other Ambulatory Visit: Payer: Self-pay

## 2018-11-12 VITALS — BP 136/89 | HR 102 | Temp 97.1°F | Ht 64.0 in | Wt 275.0 lb

## 2018-11-12 DIAGNOSIS — Z114 Encounter for screening for human immunodeficiency virus [HIV]: Secondary | ICD-10-CM

## 2018-11-12 DIAGNOSIS — B373 Candidiasis of vulva and vagina: Secondary | ICD-10-CM

## 2018-11-12 DIAGNOSIS — R0989 Other specified symptoms and signs involving the circulatory and respiratory systems: Secondary | ICD-10-CM

## 2018-11-12 DIAGNOSIS — Z862 Personal history of diseases of the blood and blood-forming organs and certain disorders involving the immune mechanism: Secondary | ICD-10-CM

## 2018-11-12 DIAGNOSIS — L989 Disorder of the skin and subcutaneous tissue, unspecified: Secondary | ICD-10-CM

## 2018-11-12 DIAGNOSIS — R198 Other specified symptoms and signs involving the digestive system and abdomen: Secondary | ICD-10-CM

## 2018-11-12 DIAGNOSIS — R09A2 Foreign body sensation, throat: Secondary | ICD-10-CM

## 2018-11-12 DIAGNOSIS — B3731 Acute candidiasis of vulva and vagina: Secondary | ICD-10-CM

## 2018-11-12 MED ORDER — FLUCONAZOLE 150 MG PO TABS: ORAL_TABLET | ORAL | 0 refills | Status: DC

## 2018-11-12 MED ORDER — FAMOTIDINE 20 MG PO TABS: 20.0000 mg | ORAL_TABLET | Freq: Every day | ORAL | 2 refills | Status: DC

## 2018-11-12 NOTE — Patient Instructions (Signed)
Pepcid 20 mg 30 min before a meal on an empty stomach    Health Maintenance, Female Adopting a healthy lifestyle and getting preventive care are important in promoting health and wellness. Ask your health care provider about:  The right schedule for you to have regular tests and exams.  Things you can do on your own to prevent diseases and keep yourself healthy. What should I know about diet, weight, and exercise? Eat a healthy diet   Eat a diet that includes plenty of vegetables, fruits, low-fat dairy products, and lean protein.  Do not eat a lot of foods that are high in solid fats, added sugars, or sodium. Maintain a healthy weight Body mass index (BMI) is used to identify weight problems. It estimates body fat based on height and weight. Your health care provider can help determine your BMI and help you achieve or maintain a healthy weight. Get regular exercise Get regular exercise. This is one of the most important things you can do for your health. Most adults should:  Exercise for at least 150 minutes each week. The exercise should increase your heart rate and make you sweat (moderate-intensity exercise).  Do strengthening exercises at least twice a week. This is in addition to the moderate-intensity exercise.  Spend less time sitting. Even light physical activity can be beneficial. Watch cholesterol and blood lipids Have your blood tested for lipids and cholesterol at 35 years of age, then have this test every 5 years. Have your cholesterol levels checked more often if:  Your lipid or cholesterol levels are high.  You are older than 35 years of age.  You are at high risk for heart disease. What should I know about cancer screening? Depending on your health history and family history, you may need to have cancer screening at various ages. This may include screening for:  Breast cancer.  Cervical cancer.  Colorectal cancer.  Skin cancer.  Lung cancer. What should  I know about heart disease, diabetes, and high blood pressure? Blood pressure and heart disease  High blood pressure causes heart disease and increases the risk of stroke. This is more likely to develop in people who have high blood pressure readings, are of African descent, or are overweight.  Have your blood pressure checked: ? Every 3-5 years if you are 90-13 years of age. ? Every year if you are 80 years old or older. Diabetes Have regular diabetes screenings. This checks your fasting blood sugar level. Have the screening done:  Once every three years after age 66 if you are at a normal weight and have a low risk for diabetes.  More often and at a younger age if you are overweight or have a high risk for diabetes. What should I know about preventing infection? Hepatitis B If you have a higher risk for hepatitis B, you should be screened for this virus. Talk with your health care provider to find out if you are at risk for hepatitis B infection. Hepatitis C Testing is recommended for:  Everyone born from [birth year] through 1967.  Anyone with known risk factors for hepatitis C. Sexually transmitted infections (STIs)  Get screened for STIs, including gonorrhea and chlamydia, if: ? You are sexually active and are younger than 35 years of age. ? You are older than 35 years of age and your health care provider tells you that you are at risk for this type of infection. ? Your sexual activity has changed since you were last screened, and  you are at increased risk for chlamydia or gonorrhea. Ask your health care provider if you are at risk.  Ask your health care provider about whether you are at high risk for HIV. Your health care provider may recommend a prescription medicine to help prevent HIV infection. If you choose to take medicine to prevent HIV, you should first get tested for HIV. You should then be tested every 3 months for as long as you are taking the medicine. Pregnancy  If you are  about to stop having your period (premenopausal) and you may become pregnant, seek counseling before you get pregnant.  Take 400 to 800 micrograms (mcg) of folic acid every day if you become pregnant.  Ask for birth control (contraception) if you want to prevent pregnancy. Osteoporosis and menopause Osteoporosis is a disease in which the bones lose minerals and strength with aging. This can result in bone fractures. If you are 59 years old or older, or if you are at risk for osteoporosis and fractures, ask your health care provider if you should:  Be screened for bone loss.  Take a calcium or vitamin D supplement to lower your risk of fractures.  Be given hormone replacement therapy (HRT) to treat symptoms of menopause. Follow these instructions at home: Lifestyle  Do not use any products that contain nicotine or tobacco, such as cigarettes, e-cigarettes, and chewing tobacco. If you need help quitting, ask your health care provider.  Do not use street drugs.  Do not share needles.  Ask your health care provider for help if you need support or information about quitting drugs. Alcohol use  Do not drink alcohol if: ? Your health care provider tells you not to drink. ? You are pregnant, may be pregnant, or are planning to become pregnant.  If you drink alcohol: ? Limit how much you use to 0-1 drink a day. ? Limit intake if you are breastfeeding.  Be aware of how much alcohol is in your drink. In the [place] one drink equals one 12 oz bottle of beer (355 mL), one 5 oz glass of wine (148 mL), or one 1 oz glass of hard liquor (44 mL). General instructions  Schedule regular health, dental, and eye exams.  Stay current with your vaccines.  Tell your health care provider if: ? You often feel depressed. ? You have ever been abused or do not feel safe at home. Summary  Adopting a healthy lifestyle and getting preventive care are important in promoting health and wellness.  Follow  your health care provider's instructions about healthy diet, exercising, and getting tested or screened for diseases.  Follow your health care provider's instructions on monitoring your cholesterol and blood pressure. This information is not intended to replace advice given to you by your health care provider. Make sure you discuss any questions you have with your health care provider. Document Released: 09/10/2010 Document Revised: 02/18/2018 Document Reviewed: 02/18/2018 Elsevier Patient Education  2020 [employer].

## 2018-11-12 NOTE — Progress Notes (Signed)
Patient: Diana Bautista Female    DOB: Dec 28, 1985   33 y.o.   MRN: [id number] Visit Date: 11/12/2018  Today's Provider: Trinna Post, PA-C   Chief Complaint  Patient presents with  . Establish Care   Subjective:     HPI   Living in [place], [place] - two children ages 57 and 21. Works as a stay at home. No health conditions.   IUD - no periods.  Last PAP 05/19/2018: Normal  Smoking, drinking, drugs: none.   Would like a referral to dermatology for two skin lesions. These have been present for years and would like to see specialist about this. One lesion is on her left ankle and then also discoloration on top of her ear concerns her.   Pt also reports feeling a "lump" in her throat.  She has a sensation of a lump in her throat. Makes her feel anxious. Does not choke. Denies history dysphagia, sore throat. Denies history of thyroid issues. Can go away with distraction   Morbid Obesity: Reports significant weight gain with her son and she gained 35 pounds during that pregnancy. After she had him and quit job and became a stay at home mom, links this to weight gain. At that time she gained weight after delivery. Son with autism, which serves as a stressor for her. Second pregnancy resulted in miscarriage. Third pregnancy, gained 20 lbs. Has decreased sugar, fried foods, mostly vegetarian. Patient voices concern about diabetes. Lost 10 pounds in the last month. Doesn't drink soft drinks, just coffee and almond milk without sugar. Does have a sweet teeth. Does walk with kids but feels she could do more. [place]   Wt Readings from Last 3 Encounters:  11/12/18 275 lb (124.7 kg)  05/19/18 277 lb (125.6 kg)  12/05/17 264 lb (119.7 kg)   BMI Readings from Last 5 Encounters:  11/12/18 47.20 kg/m  05/19/18 49.07 kg/m  12/05/17 46.77 kg/m  11/22/17 46.41 kg/m  01/15/17 48.36 kg/m   Patient reports vaginal yeast infection treated last year. Since that time she has had some itching.  PAP smear on 05/19/2018 showed candida.   Allergies  Allergen Reactions  . Penicillins Hives     Current Outpatient Medications:  .  ibuprofen (ADVIL,MOTRIN) 800 MG tablet, TK 1 T PO Q 6 TO 8 H PRN FOR PAIN, Disp: , Rfl:  .  Levonorgestrel (LILETTA, 52 MG,) 19.5 MCG/DAY IUD, by Intrauterine route., Disp: , Rfl:  .  famotidine (PEPCID) 20 MG tablet, Take 1 tablet (20 mg total) by mouth daily., Disp: 30 tablet, Rfl: 2  Review of Systems  Constitutional: Negative.   HENT: Positive for dental problem.   Eyes: Negative.   Respiratory: Negative.   Cardiovascular: Negative.   Gastrointestinal: Negative.   Endocrine: Negative.   Genitourinary: Negative.   Musculoskeletal: Negative.   Skin: Negative.   Allergic/Immunologic: Negative.   Neurological: Negative.   Hematological: Negative.   Psychiatric/Behavioral: Negative.     Social History   Tobacco Use  . Smoking status: Never Smoker  . Smokeless tobacco: Never Used  Substance Use Topics  . Alcohol use: No    Frequency: Never      Objective:   BP 136/89 (BP Location: Right Arm, Patient Position: Sitting, Cuff Size: Large)   Pulse (!) 102   Temp (!) 97.1 F (36.2 C) (Temporal)   Ht 5\' 4"  (1.626 m)   Wt 275 lb (124.7 kg)   BMI 47.20 kg/m  Vitals:   11/12/18 1051  BP: 136/89  Pulse: (!) 102  Temp: (!) 97.1 F (36.2 C)  TempSrc: Temporal  Weight: 275 lb (124.7 kg)  Height: 5\' 4"  (1.626 m)  Body mass index is 47.2 kg/m.   Physical Exam Constitutional:      Appearance: Normal appearance. She is obese.  HENT:     Right Ear: Tympanic membrane and ear canal normal.     Left Ear: Tympanic membrane and ear canal normal.     Mouth/Throat:     Mouth: Mucous membranes are moist.     Pharynx: Oropharynx is clear. No oropharyngeal exudate or posterior oropharyngeal erythema.  Eyes:     Conjunctiva/sclera: Conjunctivae normal.  Neck:     Musculoskeletal: Neck supple. No neck rigidity.     Thyroid: Thyromegaly  present.  Cardiovascular:     Rate and Rhythm: Normal rate and regular rhythm.     Heart sounds: Normal heart sounds.  Pulmonary:     Effort: Pulmonary effort is normal.     Breath sounds: Normal breath sounds.  Abdominal:     General: Bowel sounds are normal.     Palpations: Abdomen is soft.  Skin:    General: Skin is warm and dry.  Neurological:     Mental Status: She is alert and oriented to person, place, and time. Mental status is at baseline.  Psychiatric:        Mood and Affect: Mood normal.        Behavior: Behavior normal.      No results found for any visits on 11/12/18.     Assessment & Plan    1. Skin lesions  Patient asks about specific tests for cancer. Counseled this is not indicated at this time. May pursue HPV vaccine, check with insurance about coverage.   - Ambulatory referral to Dermatology  2. Globus sensation  Counseled on various causes including PND and GERD. Can also be due to anxiety sensation, which I think may be the case in this particular situation.   - famotidine (PEPCID) 20 MG tablet; Take 1 tablet (20 mg total) by mouth daily.  Dispense: 30 tablet; Refill: 2  3. Morbid obesity (HCC)  - Comprehensive Metabolic Panel (CMET) - CBC with Differential - TSH - Lipid Profile  4. History of anemia   5. Encounter for screening for HIV  - HIV antibody (with reflex)  6. Vaginal candida  - fluconazole (DIFLUCAN) 150 MG tablet; Take 1 tablet on day 1. Then second tablet 3 days later if still having symptoms.  Dispense: 2 tablet; Refill: 0  The entirety of the information documented in the History of Present Illness, Review of Systems and Physical Exam were personally obtained by me. Portions of this information were initially documented by Ashley Royalty, CMA and reviewed by me for thoroughness and accuracy.   F/u 1 year     Trinna Post, PA-C  [facility]

## 2018-11-13 ENCOUNTER — Encounter: Payer: Self-pay | Admitting: Physician Assistant

## 2018-11-13 ENCOUNTER — Telehealth: Payer: Self-pay

## 2018-11-13 LAB — CBC WITH DIFFERENTIAL/PLATELET
Basophils Absolute: 0 10*3/uL (ref 0.0–0.2)
Basos: 0 %
EOS (ABSOLUTE): 0.1 10*3/uL (ref 0.0–0.4)
Eos: 1 %
Hematocrit: 40.8 % (ref 34.0–46.6)
Hemoglobin: 13.8 g/dL (ref 11.1–15.9)
Immature Grans (Abs): 0 10*3/uL (ref 0.0–0.1)
Immature Granulocytes: 0 %
Lymphocytes Absolute: 2.2 10*3/uL (ref 0.7–3.1)
Lymphs: 32 %
MCH: 29.7 pg (ref 26.6–33.0)
MCHC: 33.8 g/dL (ref 31.5–35.7)
MCV: 88 fL (ref 79–97)
Monocytes Absolute: 0.5 10*3/uL (ref 0.1–0.9)
Monocytes: 7 %
Neutrophils Absolute: 4.1 10*3/uL (ref 1.4–7.0)
Neutrophils: 60 %
Platelets: 408 10*3/uL (ref 150–450)
RBC: 4.65 x10E6/uL (ref 3.77–5.28)
RDW: 12.9 % (ref 11.7–15.4)
WBC: 6.9 10*3/uL (ref 3.4–10.8)

## 2018-11-13 LAB — LIPID PANEL
Chol/HDL Ratio: 3.7 ratio (ref 0.0–4.4)
Cholesterol, Total: 218 mg/dL — ABNORMAL HIGH (ref 100–199)
HDL: 59 mg/dL (ref >39)
LDL Chol Calc (NIH): 146 mg/dL — ABNORMAL HIGH (ref 0–99)
Triglycerides: 74 mg/dL (ref 0–149)
VLDL Cholesterol Cal: 13 mg/dL (ref 5–40)

## 2018-11-13 LAB — COMPREHENSIVE METABOLIC PANEL
ALT: 22 IU/L (ref 0–32)
AST: 18 IU/L (ref 0–40)
Albumin/Globulin Ratio: 1.4 (ref 1.2–2.2)
Albumin: 4.3 g/dL (ref 3.8–4.8)
Alkaline Phosphatase: 70 IU/L (ref 39–117)
BUN/Creatinine Ratio: 11 (ref 9–23)
BUN: 9 mg/dL (ref 6–20)
Bilirubin Total: 0.4 mg/dL (ref 0.0–1.2)
CO2: 26 mmol/L (ref 20–29)
Calcium: 10.6 mg/dL — ABNORMAL HIGH (ref 8.7–10.2)
Chloride: 100 mmol/L (ref 96–106)
Creatinine, Ser: 0.79 mg/dL (ref 0.57–1.00)
GFR calc Af Amer: 113 mL/min/{1.73_m2} (ref >59)
GFR calc non Af Amer: 98 mL/min/{1.73_m2} (ref >59)
Globulin, Total: 3 g/dL (ref 1.5–4.5)
Glucose: 95 mg/dL (ref 65–99)
Potassium: 4.1 mmol/L (ref 3.5–5.2)
Sodium: 140 mmol/L (ref 134–144)
Total Protein: 7.3 g/dL (ref 6.0–8.5)

## 2018-11-13 LAB — TSH: TSH: 3.17 u[IU]/mL (ref 0.450–4.500)

## 2018-11-13 LAB — HIV ANTIBODY (ROUTINE TESTING W REFLEX): HIV Screen 4th Generation wRfx: NONREACTIVE

## 2018-11-13 NOTE — Telephone Encounter (Signed)
Pt advised.   Thanks,   -Laura  

## 2018-11-13 NOTE — Telephone Encounter (Signed)
-----   Message from Mar Daring, [place] sent at 11/13/2018 10:44 AM EDT ----- Kidney and liver function are normal. Sugar is normal. Sodium and potassium are normal. Calcium is borderline high. Stop any calcium supplements and limit calcium rich foods from diet. Recheck in 4 weeks with PTH and intact calcium please. Blood count is normal. Thyroid is normal. Cholesterol is elevated. Recommend limiting fatty foods, red meats, processed foods in diet. Can recheck in 6 months. If still elevated may need to consider cholesterol lowering medications. HIV screen done once in lifetime, unless exposed, is negative.

## 2018-11-17 NOTE — Telephone Encounter (Signed)
Please review for Adriana.  Thanks,   -Mickel Baas

## 2018-11-18 ENCOUNTER — Other Ambulatory Visit: Payer: Self-pay | Admitting: Physician Assistant

## 2019-01-06 ENCOUNTER — Other Ambulatory Visit: Payer: Self-pay

## 2019-01-07 DIAGNOSIS — L608 Other nail disorders: Secondary | ICD-10-CM | POA: Diagnosis not present

## 2019-01-07 DIAGNOSIS — D1800 Hemangioma unspecified site: Secondary | ICD-10-CM | POA: Diagnosis not present

## 2019-01-07 DIAGNOSIS — D2262 Melanocytic nevi of left upper limb, including shoulder: Secondary | ICD-10-CM | POA: Diagnosis not present

## 2019-01-07 DIAGNOSIS — L821 Other seborrheic keratosis: Secondary | ICD-10-CM | POA: Diagnosis not present

## 2019-01-07 DIAGNOSIS — D225 Melanocytic nevi of trunk: Secondary | ICD-10-CM | POA: Diagnosis not present

## 2019-01-07 DIAGNOSIS — Z1283 Encounter for screening for malignant neoplasm of skin: Secondary | ICD-10-CM | POA: Diagnosis not present

## 2019-01-07 DIAGNOSIS — D1801 Hemangioma of skin and subcutaneous tissue: Secondary | ICD-10-CM | POA: Diagnosis not present

## 2019-01-07 DIAGNOSIS — D485 Neoplasm of uncertain behavior of skin: Secondary | ICD-10-CM | POA: Diagnosis not present

## 2019-01-07 LAB — PTH, INTACT AND CALCIUM
Calcium: 8.9 mg/dL (ref 8.7–10.2)
PTH: 33 pg/mL (ref 15–65)

## 2019-02-18 ENCOUNTER — Other Ambulatory Visit: Payer: Self-pay | Admitting: Obstetrics and Gynecology

## 2019-02-18 ENCOUNTER — Encounter: Payer: Self-pay | Admitting: Obstetrics and Gynecology

## 2019-02-18 DIAGNOSIS — B373 Candidiasis of vulva and vagina: Secondary | ICD-10-CM

## 2019-02-18 DIAGNOSIS — B3731 Acute candidiasis of vulva and vagina: Secondary | ICD-10-CM

## 2019-02-18 MED ORDER — FLUCONAZOLE 150 MG PO TABS: 150.0000 mg | ORAL_TABLET | Freq: Once | ORAL | 0 refills | Status: AC

## 2019-02-18 NOTE — Progress Notes (Signed)
Rx diflucan for yeast vag sx.  

## 2019-02-24 ENCOUNTER — Ambulatory Visit: Payer: Medicaid Other | Admitting: Obstetrics and Gynecology

## 2019-02-28 NOTE — Progress Notes (Signed)
Trinna Post, PA-C   Chief Complaint  Patient presents with  . Contraception    IUD removal, interested in OCP's    HPI:      Ms. Diana Bautista is a 33 y.o. [id number] who LMP was No LMP recorded. (Menstrual status: IUD)., presents today for IUD removal and OCP start. Did OCPs in past and did well. Wants to restart. No hx of HTN, DVTs, migraines with aura. BP elevated today but pt has white coat syndrome. Had normal BP of 120/80 at 3/20 appt.  Liletta placed 12/15. Pt with occas pelvic pains and vag pain recently. Also with recurrent yeast vag sx for past yr. Has itch/d/c. Treats with diflucan or OTC meds with temporary relief. Sx usually before menses. Using dove sens skin soap, and dryer sheets. Did probiotics without help. Last treated with diflucan 2 wks ago with sx relief. No sx today.   Patient Active Problem List   Diagnosis Date Noted  . Morbid obesity ([place]) 11/12/2018    Past Surgical History:  Procedure Laterality Date  . left salpingectomy  2013   Ectopic pregnancy  . MYRINGOTOMY WITH TUBE PLACEMENT    . WISDOM TOOTH EXTRACTION      Family History  Problem Relation Age of Onset  . Hodgkin's lymphoma Paternal Grandmother   . GER disease Mother   . Hypertension Mother   . COPD Father   . Cerebrovascular Accident Maternal Grandmother   . Heart disease Maternal Grandmother   . Heart disease Maternal Grandfather   . Autism Son   . Breast cancer Neg Hx   . Ovarian cancer Neg Hx     Social History   Socioeconomic History  . Marital status: Single    Spouse name: Not on file  . Number of children: Not on file  . Years of education: Not on file  . Highest education level: Not on file  Occupational History  . Not on file  Tobacco Use  . Smoking status: Never Smoker  . Smokeless tobacco: Never Used  Substance and Sexual Activity  . Alcohol use: No  . Drug use: No  . Sexual activity: Yes    Partners: Male    Birth control/protection: I.U.D.   Comment: Lyletta  Other Topics Concern  . Not on file  Social History Narrative  . Not on file   Social Determinants of Health   Financial Resource Strain:   . Difficulty of Paying Living Expenses: Not on file  Food Insecurity:   . Worried About Charity fundraiser in the Last Year: Not on file  . Ran Out of Food in the Last Year: Not on file  Transportation Needs:   . Lack of Transportation (Medical): Not on file  . Lack of Transportation (Non-Medical): Not on file  Physical Activity:   . Days of Exercise per Week: Not on file  . Minutes of Exercise per Session: Not on file  Stress:   . Feeling of Stress : Not on file  Social Connections:   . Frequency of Communication with Friends and Family: Not on file  . Frequency of Social Gatherings with Friends and Family: Not on file  . Attends Religious Services: Not on file  . Active Member of Clubs or Organizations: Not on file  . Attends Archivist Meetings: Not on file  . Marital Status: Not on file  Intimate Partner Violence:   . Fear of Current or Ex-Partner: Not on file  .  Emotionally Abused: Not on file  . Physically Abused: Not on file  . Sexually Abused: Not on file    Outpatient Medications Prior to Visit  Medication Sig Dispense Refill  . Levonorgestrel (LILETTA, 52 MG,) 19.5 MCG/DAY IUD by Intrauterine route.    . famotidine (PEPCID) 20 MG tablet Take 1 tablet (20 mg total) by mouth daily. 30 tablet 2  . ibuprofen (ADVIL,MOTRIN) 800 MG tablet TK 1 T PO Q 6 TO 8 H PRN FOR PAIN     No facility-administered medications prior to visit.      ROS:  Review of Systems  Constitutional: Negative for fever.  Gastrointestinal: Negative for blood in stool, constipation, diarrhea, nausea and vomiting.  Genitourinary: Negative for dyspareunia, dysuria, flank pain, frequency, hematuria, urgency, vaginal bleeding, vaginal discharge and vaginal pain.  Musculoskeletal: Negative for back pain.  Skin: Negative for  rash.  BREAST: No symptoms   OBJECTIVE:   Vitals:  BP (!) 140/100   Ht 5\' 3"  (1.6 m)   Wt 289 lb (131.1 kg)   BMI 51.19 kg/m   Repeat was 142/94  Physical Exam Vitals reviewed.  Constitutional:      Appearance: She is well-developed.  Pulmonary:     Effort: Pulmonary effort is normal.  Genitourinary:    General: Normal vulva.     Pubic Area: No rash.      Labia:        Right: No rash, tenderness or lesion.        Left: No rash, tenderness or lesion.      Vagina: Normal. No vaginal discharge, erythema or tenderness.     Cervix: Normal.     Comments: IUD STRINGS IN CX OS Musculoskeletal:        General: Normal range of motion.     Cervical back: Normal range of motion.  Skin:    General: Skin is warm and dry.  Neurological:     General: No focal deficit present.     Mental Status: She is alert and oriented to person, place, and time.  Psychiatric:        Mood and Affect: Mood normal.        Behavior: Behavior normal.        Thought Content: Thought content normal.        Judgment: Judgment normal.     IUD Removal Strings of IUD identified and grasped.  IUD removed without problem with ring forceps.  Pt tolerated this well.  IUD noted to be intact.  Assessment/Plan: Encounter for initial prescription of contraceptive pills - Plan: Norethindrone Acetate-Ethinyl Estrad-FE (MICROGESTIN 24 FE) 1-20 MG-MCG(24) tablet; OCP start today, condoms for 1 wk.  Encounter for IUD removal  Elevated blood pressure reading without diagnosis of hypertension--No hx of HTN. Rechk at home (mom is nurse), can RTO for BP recheck too. F/u if still elevated. Recheck at 3/21 annual, too.    Meds ordered this encounter  Medications  . Norethindrone Acetate-Ethinyl Estrad-FE (MICROGESTIN 24 FE) 1-20 MG-MCG(24) tablet    Sig: Take 1 tablet by mouth daily.    Dispense:  84 tablet    Refill:  0    Order Specific Question:   Supervising Provider    Answer:   Gae Dry [id number]       Return in about 3 months (around [id number]) for annual.  Allizon Woznick B. Tykeshia Tourangeau, PA-C 03/01/2019 10:51 AM

## 2019-03-01 ENCOUNTER — Other Ambulatory Visit: Payer: Self-pay

## 2019-03-01 ENCOUNTER — Encounter: Payer: Self-pay | Admitting: Obstetrics and Gynecology

## 2019-03-01 ENCOUNTER — Ambulatory Visit (INDEPENDENT_AMBULATORY_CARE_PROVIDER_SITE_OTHER): Payer: Medicaid Other | Admitting: Obstetrics and Gynecology

## 2019-03-01 VITALS — BP 140/100 | Ht 63.0 in | Wt 289.0 lb

## 2019-03-01 DIAGNOSIS — Z30011 Encounter for initial prescription of contraceptive pills: Secondary | ICD-10-CM | POA: Diagnosis not present

## 2019-03-01 DIAGNOSIS — R030 Elevated blood-pressure reading, without diagnosis of hypertension: Secondary | ICD-10-CM | POA: Diagnosis not present

## 2019-03-01 DIAGNOSIS — Z30432 Encounter for removal of intrauterine contraceptive device: Secondary | ICD-10-CM | POA: Diagnosis not present

## 2019-03-01 MED ORDER — MICROGESTIN 24 FE 1-20 MG-MCG PO TABS: 1.0000 | ORAL_TABLET | Freq: Every day | ORAL | 0 refills | Status: DC

## 2019-03-01 NOTE — Patient Instructions (Signed)
I value your feedback and entrusting us with your care. If you get a [place] patient survey, I would appreciate you taking the time to let us know about your experience today. Thank you!  As of February 18, 2019, your lab results will be released to your MyChart immediately, before I even have a chance to see them. Please give me time to review them and contact you if there are any abnormalities. Thank you for your patience.  

## 2019-03-14 ENCOUNTER — Encounter: Payer: Self-pay | Admitting: Physician Assistant

## 2019-04-16 ENCOUNTER — Ambulatory Visit (INDEPENDENT_AMBULATORY_CARE_PROVIDER_SITE_OTHER): Payer: Medicaid Other | Admitting: Physician Assistant

## 2019-04-16 DIAGNOSIS — R21 Rash and other nonspecific skin eruption: Secondary | ICD-10-CM | POA: Diagnosis not present

## 2019-04-16 DIAGNOSIS — G43809 Other migraine, not intractable, without status migrainosus: Secondary | ICD-10-CM

## 2019-04-16 MED ORDER — SUMATRIPTAN SUCCINATE 50 MG PO TABS: 50.0000 mg | ORAL_TABLET | Freq: Once | ORAL | 0 refills | Status: DC

## 2019-04-16 MED ORDER — NYSTATIN-TRIAMCINOLONE 100000-0.1 UNIT/GM-% EX CREA: 1.0000 "application " | TOPICAL_CREAM | Freq: Two times a day (BID) | CUTANEOUS | 0 refills | Status: DC

## 2019-04-16 NOTE — Progress Notes (Signed)
Patient: Diana Bautista Female    DOB: [birth year]   34 y.o.   MRN: [id number] Visit Date: 04/16/2019  Today's Provider: Trinna Post, PA-C   Chief Complaint  Patient presents with  . Rash   Subjective:    Virtual Visit via Video Note  I connected with Diana Bautista on 04/16/19 at  8:40 AM EST by a video enabled telemedicine application and verified that I am speaking with the correct person using two identifiers.  Location: Patient: Home Provider: Office   I discussed the limitations of evaluation and management by telemedicine and the availability of in person appointments. The patient expressed understanding and agreed to proceed.    Rash This is a new problem. The current episode started in the past 7 days. The problem is unchanged (rash has gotten bigger). The affected locations include the neck. The rash is characterized by dryness, redness, burning and itchiness. It is unknown if there was an exposure to a precipitant. Pertinent negatives include no cough, diarrhea, fatigue, fever, shortness of breath or sore throat. Past treatments include antihistamine and anti-itch cream. The treatment provided no relief. There is no history of allergies or eczema.   Has used benadryl, neopsorin, and cortisone. Felt benadryl made it better and neosporin made it worse.   Patient also reports she is having some headaches that start around her eyes. History of migraines previously that she took abortive therapy for.  Allergies  Allergen Reactions  . Penicillins Hives     Current Outpatient Medications:  .  famotidine (PEPCID) 20 MG tablet, Take 1 tablet (20 mg total) by mouth daily., Disp: 30 tablet, Rfl: 2 .  Levonorgestrel (LILETTA, 52 MG,) 19.5 MCG/DAY IUD, by Intrauterine route., Disp: , Rfl:  .  Norethindrone Acetate-Ethinyl Estrad-FE (MICROGESTIN 24 FE) 1-20 MG-MCG(24) tablet, Take 1 tablet by mouth daily., Disp: 84 tablet, Rfl: 0  Review of Systems  Constitutional:  Negative for fatigue and fever.  HENT: Negative for sore throat.   Respiratory: Negative for cough and shortness of breath.   Gastrointestinal: Negative for diarrhea.  Skin: Positive for rash.    Social History   Tobacco Use  . Smoking status: Never Smoker  . Smokeless tobacco: Never Used  Substance Use Topics  . Alcohol use: No      Objective:   There were no vitals taken for this visit. There were no vitals filed for this visit.There is no height or weight on file to calculate BMI.   Physical Exam Constitutional:      Appearance: Normal appearance. She is not ill-appearing.  Neck:      Comments: Erythematous macular rash at base of neck with one circular portion of clearing in the center.  Skin:    General: Skin is warm and dry.  Neurological:     Mental Status: She is alert and oriented to person, place, and time. Mental status is at baseline.  Psychiatric:        Mood and Affect: Mood normal.        Behavior: Behavior normal.      No results found for any visits on 04/16/19.     Assessment & Plan    1. Rash  Treat as below.  - nystatin-triamcinolone (MYCOLOG II) cream; Apply 1 application topically 2 (two) times daily.  Dispense: 30 g; Refill: 0  2. Other migraine without status migrainosus, not intractable  - SUMAtriptan (IMITREX) 50 MG tablet; Take 1 tablet (50 mg total)  by mouth once for 1 dose. May repeat in 2 hours if headache persists or recurs.  Dispense: 10 tablet; Refill: 0  I discussed the assessment and treatment plan with the patient. The patient was provided an opportunity to ask questions and all were answered. The patient agreed with the plan and demonstrated an understanding of the instructions.   The patient was advised to call back or seek an in-person evaluation if the symptoms worsen or if the condition fails to improve as anticipated.  I provided 25 minutes of non-face-to-face time during this encounter.     Trinna Post, PA-C    [facility]

## 2019-04-20 NOTE — Patient Instructions (Signed)
Rash, Adult  A rash is a change in the color of your skin. A rash can also change the way your skin feels. There are many different conditions and factors that can cause a rash. Follow these instructions at home: The goal of treatment is to stop the itching and keep the rash from spreading. Watch for any changes in your symptoms. Let your doctor know about them. Follow these instructions to help with your condition: Medicine Take or apply over-the-counter and prescription medicines only as told by your doctor. These may include medicines:  To treat red or swollen skin (corticosteroid creams).  To treat itching.  To treat an allergy (oral antihistamines).  To treat very bad symptoms (oral corticosteroids).  Skin care  Put cool cloths (compresses) on the affected areas.  Do not scratch or rub your skin.  Avoid covering the rash. Make sure that the rash is exposed to air as much as possible. Managing itching and discomfort  Avoid hot showers or baths. These can make itching worse. A cold shower may help.  Try taking a bath with: ? Epsom salts. You can get these at your local pharmacy or grocery store. Follow the instructions on the package. ? Baking soda. Pour a small amount into the bath as told by your doctor. ? Colloidal oatmeal. You can get this at your local pharmacy or grocery store. Follow the instructions on the package.  Try putting baking soda paste onto your skin. Stir water into baking soda until it gets like a paste.  Try putting on a lotion that relieves itchiness (calamine lotion).  Keep cool and out of the sun. Sweating and being hot can make itching worse. General instructions   Rest as needed.  Drink enough fluid to keep your pee (urine) pale yellow.  Wear loose-fitting clothing.  Avoid scented soaps, detergents, and perfumes. Use gentle soaps, detergents, perfumes, and other cosmetic products.  Avoid anything that causes your rash. Keep a journal to  help track what causes your rash. Write down: ? What you eat. ? What cosmetic products you use. ? What you drink. ? What you wear. This includes jewelry.  Keep all follow-up visits as told by your doctor. This is important. Contact a doctor if:  You sweat at night.  You lose weight.  You pee (urinate) more than normal.  You pee less than normal, or you notice that your pee is a darker color than normal.  You feel weak.  You throw up (vomit).  Your skin or the whites of your eyes look yellow (jaundice).  Your skin: ? Tingles. ? Is numb.  Your rash: ? Does not go away after a few days. ? Gets worse.  You are: ? More thirsty than normal. ? More tired than normal.  You have: ? New symptoms. ? Pain in your belly (abdomen). ? A fever. ? Watery poop (diarrhea). Get help right away if:  You have a fever and your symptoms suddenly get worse.  You start to feel mixed up (confused).  You have a very bad headache or a stiff neck.  You have very bad joint pains or stiffness.  You have jerky movements that you cannot control (seizure).  Your rash covers all or most of your body. The rash may or may not be painful.  You have blisters that: ? Are on top of the rash. ? Grow larger. ? Grow together. ? Are painful. ? Are inside your nose or mouth.  You have a rash   that: ? Looks like purple pinprick-sized spots all over your body. ? Has a "bull's eye" or looks like a target. ? Is red and painful, causes your skin to peel, and is not from being in the sun too long. Summary  A rash is a change in the color of your skin. A rash can also change the way your skin feels.  The goal of treatment is to stop the itching and keep the rash from spreading.  Take or apply over-the-counter and prescription medicines only as told by your doctor.  Contact a doctor if you have new symptoms or symptoms that get worse.  Keep all follow-up visits as told by your doctor. This is  important. This information is not intended to replace advice given to you by your health care provider. Make sure you discuss any questions you have with your health care provider. Document Revised: 06/19/2018 Document Reviewed: 09/29/2017 Elsevier Patient Education  2020 Elsevier Inc.  

## 2019-04-27 ENCOUNTER — Encounter: Payer: Self-pay | Admitting: Physician Assistant

## 2019-04-27 DIAGNOSIS — R21 Rash and other nonspecific skin eruption: Secondary | ICD-10-CM

## 2019-04-27 MED ORDER — NYSTATIN-TRIAMCINOLONE 100000-0.1 UNIT/GM-% EX CREA: 1.0000 "application " | TOPICAL_CREAM | Freq: Two times a day (BID) | CUTANEOUS | 0 refills | Status: DC

## 2019-05-13 DIAGNOSIS — H5213 Myopia, bilateral: Secondary | ICD-10-CM | POA: Diagnosis not present

## 2019-05-13 DIAGNOSIS — H5203 Hypermetropia, bilateral: Secondary | ICD-10-CM | POA: Diagnosis not present

## 2019-05-22 ENCOUNTER — Other Ambulatory Visit: Payer: Self-pay | Admitting: Obstetrics and Gynecology

## 2019-05-22 DIAGNOSIS — Z30011 Encounter for initial prescription of contraceptive pills: Secondary | ICD-10-CM

## 2019-05-23 ENCOUNTER — Encounter: Payer: Self-pay | Admitting: Obstetrics and Gynecology

## 2019-05-23 DIAGNOSIS — Z30011 Encounter for initial prescription of contraceptive pills: Secondary | ICD-10-CM

## 2019-05-25 ENCOUNTER — Other Ambulatory Visit: Payer: Self-pay | Admitting: Obstetrics and Gynecology

## 2019-05-25 DIAGNOSIS — Z30011 Encounter for initial prescription of contraceptive pills: Secondary | ICD-10-CM

## 2019-05-25 MED ORDER — MICROGESTIN 24 FE 1-20 MG-MCG PO TABS: 1.0000 | ORAL_TABLET | Freq: Every day | ORAL | 0 refills | Status: DC

## 2019-06-02 DIAGNOSIS — H5203 Hypermetropia, bilateral: Secondary | ICD-10-CM | POA: Diagnosis not present

## 2019-06-15 NOTE — Progress Notes (Signed)
Acute Office Visit  Subjective:    Patient ID: Diana Bautista, female    DOB: [birth year], 34 y.o.   MRN: [id number]  Chief Complaint  Patient presents with  . Gynecologic Exam  . Epistaxis  . Anxiety  . Breast Mass    Gynecologic Exam Pertinent negatives include no sore throat.   Patient is in today for PAP Smear. Originally had scheduled with OBGYN but they were very booked out. She also has some vaginal discharge and reports chronic yeast infections. Reports her OBGYN was going to get specific breakdown of yeast organisms. Last PAP 05/19/2018 and was normal. PAP/HPV 2018 normal and negative.   She has some concern of breast mass in her bilateral breasts. She is usually concerned about getting cancer. No personal or family history of breast cancer. 2017 bilateral mammogram was normal and negative for concerning lesions. She has felt her breasts to the extent that there are bruises.   She has some issues of nosebleeds over the past few days. She uses flonase regularly. Sometimes she will use a kleenex and wrap it around her finger and insert it in her nose. After this, she will notice some blood on the tissue.   Past Medical History:  Diagnosis Date  . Ectopic pregnancy 2013    Past Surgical History:  Procedure Laterality Date  . left salpingectomy  2013   Ectopic pregnancy  . MYRINGOTOMY WITH TUBE PLACEMENT    . WISDOM TOOTH EXTRACTION      Family History  Problem Relation Age of Onset  . Hodgkin's lymphoma Paternal Grandmother   . GER disease Mother   . Hypertension Mother   . COPD Father   . Cerebrovascular Accident Maternal Grandmother   . Heart disease Maternal Grandmother   . Heart disease Maternal Grandfather   . Autism Son   . Breast cancer Neg Hx   . Ovarian cancer Neg Hx     Social History   Socioeconomic History  . Marital status: Single    Spouse name: Not on file  . Number of children: Not on file  . Years of education: Not on file  . Highest  education level: Not on file  Occupational History  . Not on file  Tobacco Use  . Smoking status: Never Smoker  . Smokeless tobacco: Never Used  Substance and Sexual Activity  . Alcohol use: No  . Drug use: No  . Sexual activity: Yes    Partners: Male    Birth control/protection: I.U.D.    Comment: Lyletta  Other Topics Concern  . Not on file  Social History Narrative  . Not on file   Social Determinants of Health   Financial Resource Strain:   . Difficulty of Paying Living Expenses:   Food Insecurity:   . Worried About Charity fundraiser in the Last Year:   . Arboriculturist in the Last Year:   Transportation Needs:   . Film/video editor (Medical):   Marland Kitchen Lack of Transportation (Non-Medical):   Physical Activity:   . Days of Exercise per Week:   . Minutes of Exercise per Session:   Stress:   . Feeling of Stress :   Social Connections:   . Frequency of Communication with Friends and Family:   . Frequency of Social Gatherings with Friends and Family:   . Attends Religious Services:   . Active Member of Clubs or Organizations:   . Attends Archivist Meetings:   Marland Kitchen Marital  Status:   Intimate Partner Violence:   . Fear of Current or Ex-Partner:   . Emotionally Abused:   Marland Kitchen Physically Abused:   . Sexually Abused:     Outpatient Medications Prior to Visit  Medication Sig Dispense Refill  . BLISOVI 24 FE 1-20 MG-MCG(24) tablet TAKE 1 TABLET BY MOUTH DAILY 84 tablet 0  . nystatin-triamcinolone (MYCOLOG II) cream Apply 1 application topically 2 (two) times daily. 30 g 0  . famotidine (PEPCID) 20 MG tablet Take 1 tablet (20 mg total) by mouth daily. 30 tablet 2  . Levonorgestrel (LILETTA, 52 MG,) 19.5 MCG/DAY IUD by Intrauterine route.    . SUMAtriptan (IMITREX) 50 MG tablet Take 1 tablet (50 mg total) by mouth once for 1 dose. May repeat in 2 hours if headache persists or recurs. 10 tablet 0   No facility-administered medications prior to visit.     Allergies  Allergen Reactions  . Penicillins Hives    Review of Systems  Constitutional: Negative.   HENT: Positive for nosebleeds. Negative for congestion, dental problem, drooling, ear discharge, ear pain, facial swelling, hearing loss, mouth sores, postnasal drip, rhinorrhea, sinus pressure, sinus pain, sneezing, sore throat, tinnitus, trouble swallowing and voice change.   Eyes: Negative.   Respiratory: Negative.   Gastrointestinal: Negative.   Endocrine: Negative.   Allergic/Immunologic: Negative.        Objective:    Physical Exam Exam conducted with a chaperone present.  Constitutional:      Appearance: Normal appearance.  Cardiovascular:     Rate and Rhythm: Normal rate and regular rhythm.     Heart sounds: Normal heart sounds.  Pulmonary:     Effort: Pulmonary effort is normal.     Breath sounds: Normal breath sounds.  Chest:       Comments: Bruises on bilateral breasts. No masses appreciated.  Genitourinary:    General: Normal vulva.     Exam position: Lithotomy position.     Vagina: Vaginal discharge present.     Cervix: Normal.  Skin:    General: Skin is warm and dry.  Neurological:     Mental Status: She is alert and oriented to person, place, and time. Mental status is at baseline.  Psychiatric:        Mood and Affect: Mood normal.        Behavior: Behavior normal.     BP (!) 148/93 (BP Location: Left Arm, Patient Position: Sitting, Cuff Size: Large)   Pulse (!) 108   Temp (!) 97.5 F (36.4 C) (Temporal)   Wt 291 lb (132 kg)   BMI 51.55 kg/m  Wt Readings from Last 3 Encounters:  06/16/19 291 lb (132 kg)  03/01/19 289 lb (131.1 kg)  11/12/18 275 lb (124.7 kg)    There are no preventive care reminders to display for this patient.  There are no preventive care reminders to display for this patient.   Lab Results  Component Value Date   TSH 3.170 11/12/2018   Lab Results  Component Value Date   WBC 6.9 11/12/2018   HGB 13.8  11/12/2018   HCT 40.8 11/12/2018   MCV 88 11/12/2018   PLT 408 11/12/2018   Lab Results  Component Value Date   NA 140 11/12/2018   K 4.1 11/12/2018   CO2 26 11/12/2018   GLUCOSE 95 11/12/2018   BUN 9 11/12/2018   CREATININE 0.79 11/12/2018   BILITOT 0.4 11/12/2018   ALKPHOS 70 11/12/2018   AST 18 11/12/2018  ALT 22 11/12/2018   PROT 7.3 11/12/2018   ALBUMIN 4.3 11/12/2018   CALCIUM 8.9 01/06/2019   ANIONGAP 9 12/21/2013   Lab Results  Component Value Date   CHOL 218 (H) 11/12/2018   Lab Results  Component Value Date   HDL 59 11/12/2018   Lab Results  Component Value Date   LDLCALC 146 (H) 11/12/2018   Lab Results  Component Value Date   TRIG 74 11/12/2018   Lab Results  Component Value Date   CHOLHDL 3.7 11/12/2018   No results found for: HGBA1C     Assessment & Plan:  1. Cervical cancer screening  - Cytology - PAP  2. Vaginal discharge  Advised if she would like breakdown of microorganisms to follow up with OBGYN for that specific testing.   - Cervicovaginal ancillary only - fluconazole (DIFLUCAN) 150 MG tablet; Take one pill day one and repeat three days later if symptomatic.  Dispense: 2 tablet; Refill: 0  3. Breast pain  No masses felt.   - MM DIAG BREAST TOMO BILATERAL; Future - US BREAST LTD UNI RIGHT INC AXILLA; Future - US BREAST LTD UNI LEFT INC AXILLA; Future  4. Frequent nosebleeds  Likely due to flonase and digital trauam.   5. Morbid obesity ([place])   Trinna Post, PA-C

## 2019-06-16 ENCOUNTER — Other Ambulatory Visit (HOSPITAL_COMMUNITY)
Admission: RE | Admit: 2019-06-16 | Discharge: 2019-06-16 | Disposition: A | Discharge status: Home / Self Care | Payer: Medicaid Other | Source: Ambulatory Visit | Attending: Physician Assistant | Admitting: Physician Assistant

## 2019-06-16 ENCOUNTER — Encounter: Payer: Self-pay | Admitting: Physician Assistant

## 2019-06-16 ENCOUNTER — Other Ambulatory Visit: Payer: Self-pay

## 2019-06-16 ENCOUNTER — Telehealth: Payer: Self-pay

## 2019-06-16 ENCOUNTER — Ambulatory Visit (INDEPENDENT_AMBULATORY_CARE_PROVIDER_SITE_OTHER): Payer: Medicaid Other | Admitting: Physician Assistant

## 2019-06-16 VITALS — BP 148/93 | HR 108 | Temp 97.5°F | Wt 291.0 lb

## 2019-06-16 DIAGNOSIS — N644 Mastodynia: Secondary | ICD-10-CM | POA: Diagnosis not present

## 2019-06-16 DIAGNOSIS — R040 Epistaxis: Secondary | ICD-10-CM | POA: Diagnosis not present

## 2019-06-16 DIAGNOSIS — N898 Other specified noninflammatory disorders of vagina: Secondary | ICD-10-CM

## 2019-06-16 DIAGNOSIS — Z124 Encounter for screening for malignant neoplasm of cervix: Secondary | ICD-10-CM

## 2019-06-16 MED ORDER — FLUCONAZOLE 150 MG PO TABS: ORAL_TABLET | ORAL | 0 refills | Status: DC

## 2019-06-16 NOTE — Telephone Encounter (Signed)
Copied from [place] [phone]. Topic: General - Other >> Jun 16, 2019  3:42 PM Parke Poisson wrote: Reason for CRM:I will need to know clock position of pain in order to schedule mammogram

## 2019-06-17 LAB — CERVICOVAGINAL ANCILLARY ONLY
Bacterial Vaginitis (gardnerella): NEGATIVE
Candida Glabrata: NEGATIVE
Candida Vaginitis: POSITIVE — AB
Chlamydia: NEGATIVE
Comment: NEGATIVE
Comment: NEGATIVE
Comment: NEGATIVE
Comment: NEGATIVE
Comment: NEGATIVE
Comment: NORMAL
Neisseria Gonorrhea: NEGATIVE
Trichomonas: NEGATIVE

## 2019-06-17 NOTE — Telephone Encounter (Signed)
Answer forwarded to Parke Poisson for review.

## 2019-06-17 NOTE — Telephone Encounter (Signed)
Right breast 9 o clock and left breast 3 o clock.

## 2019-06-18 LAB — CYTOLOGY - PAP
Comment: NEGATIVE
Diagnosis: NEGATIVE
High risk HPV: NEGATIVE

## 2019-06-21 ENCOUNTER — Encounter: Payer: Self-pay | Admitting: Physician Assistant

## 2019-06-22 ENCOUNTER — Ambulatory Visit: Payer: Medicaid Other | Admitting: Obstetrics and Gynecology

## 2019-06-29 ENCOUNTER — Ambulatory Visit
Admission: RE | Admit: 2019-06-29 | Discharge: 2019-06-29 | Disposition: A | Discharge status: Home / Self Care | Payer: Medicaid Other | Source: Ambulatory Visit | Attending: Physician Assistant | Admitting: Physician Assistant

## 2019-06-29 ENCOUNTER — Ambulatory Visit: Payer: Medicaid Other | Admitting: Obstetrics and Gynecology

## 2019-06-29 DIAGNOSIS — R928 Other abnormal and inconclusive findings on diagnostic imaging of breast: Secondary | ICD-10-CM | POA: Diagnosis not present

## 2019-06-29 DIAGNOSIS — N644 Mastodynia: Secondary | ICD-10-CM | POA: Diagnosis not present

## 2019-07-08 ENCOUNTER — Other Ambulatory Visit: Payer: Self-pay | Admitting: Physician Assistant

## 2019-07-08 DIAGNOSIS — G43809 Other migraine, not intractable, without status migrainosus: Secondary | ICD-10-CM

## 2019-07-08 MED ORDER — SUMATRIPTAN SUCCINATE 50 MG PO TABS: 50.0000 mg | ORAL_TABLET | Freq: Once | ORAL | 0 refills | Status: DC

## 2019-08-10 ENCOUNTER — Encounter: Payer: Self-pay | Admitting: Physician Assistant

## 2019-08-11 NOTE — Telephone Encounter (Signed)
Please schedule MyChart OV or in person OV to assess, this is a problem I have not looked at before.

## 2019-08-13 ENCOUNTER — Ambulatory Visit: Payer: Medicaid Other | Admitting: Physician Assistant

## 2019-08-18 ENCOUNTER — Other Ambulatory Visit: Payer: Self-pay | Admitting: Obstetrics and Gynecology

## 2019-08-18 DIAGNOSIS — Z30011 Encounter for initial prescription of contraceptive pills: Secondary | ICD-10-CM

## 2019-08-23 ENCOUNTER — Encounter: Payer: Self-pay | Admitting: Obstetrics and Gynecology

## 2019-08-23 NOTE — Telephone Encounter (Signed)
Called and left voicemail for patient to call back to be scheduled. 

## 2019-08-24 ENCOUNTER — Other Ambulatory Visit: Payer: Self-pay | Admitting: Obstetrics and Gynecology

## 2019-08-24 ENCOUNTER — Encounter: Payer: Self-pay | Admitting: Physician Assistant

## 2019-08-24 DIAGNOSIS — Z30011 Encounter for initial prescription of contraceptive pills: Secondary | ICD-10-CM

## 2019-08-24 MED ORDER — BLISOVI 24 FE 1-20 MG-MCG(24) PO TABS: 1.0000 | ORAL_TABLET | Freq: Every day | ORAL | 0 refills | Status: DC

## 2019-08-24 NOTE — Progress Notes (Signed)
Rx RF OCPs. PCP will be managing going forward.

## 2019-09-16 ENCOUNTER — Other Ambulatory Visit: Payer: Self-pay

## 2019-09-16 ENCOUNTER — Ambulatory Visit: Payer: Medicaid Other | Admitting: Dermatology

## 2019-09-16 DIAGNOSIS — L720 Epidermal cyst: Secondary | ICD-10-CM | POA: Diagnosis not present

## 2019-09-16 MED ORDER — CLOBETASOL PROPIONATE 0.05 % EX SOLN: 1.0000 "application " | CUTANEOUS | 0 refills | Status: DC

## 2019-09-16 MED ORDER — DOXYCYCLINE MONOHYDRATE 100 MG PO CAPS: 100.0000 mg | ORAL_CAPSULE | Freq: Two times a day (BID) | ORAL | 1 refills | Status: DC

## 2019-09-16 NOTE — Patient Instructions (Signed)
Doxycycline 100mg  Take 1 pill 2 times a day with food and drink for 2 weeks, then decrease to 1 pill a day for 4 weeks

## 2019-09-16 NOTE — Progress Notes (Signed)
   Follow-Up Visit   Subjective  Diana Bautista is a 34 y.o. female who presents for the following: Cyst (R post scalp 8wks, growing, ).   The following portions of the chart were reviewed this encounter and updated as appropriate:      Review of Systems:  No other skin or systemic complaints except as noted in HPI or Assessment and Plan.  Objective  Well appearing patient in no apparent distress; mood and affect are within normal limits.  A focused examination was performed including scalp. Relevant physical exam findings are noted in the Assessment and Plan.  Objective  Right Occipital Scalp: 6.53mm pink firm SQ nodule R occipital scalp   Assessment & Plan  Epidermal cyst Right Occipital Scalp  Inflamed Reassured benign growth.  Recommend observation.  Discussed surgical excision in office if no improvement Start Doxycycline mono 100mg  1 po bid with food and drink #30, 1rf Start Clobetasol sol qd/bid aa until clear, avoid f/g/a  Doxycycline should be taken with food to prevent nausea. Do not lay down for 30 minutes after taking. Be cautious with sun exposure and use good sun protection while on this medication. Pregnant women should not take this medication.    Topical steroids (such as triamcinolone, fluocinolone, fluocinonide, mometasone, clobetasol, halobetasol, betamethasone, hydrocortisone) can cause thinning and lightening of the skin if they are used for too long in the same area. Your physician has selected the right strength medicine for your problem and area affected on the body. Please use your medication only as directed by your physician to prevent side effects.    clobetasol (TEMOVATE) 0.05 % external solution - Right Occipital Scalp  Return if symptoms worsen or fail to improve.   I, Othelia Pulling, RMA, am acting as scribe for Brendolyn Patty, MD . Documentation: I have reviewed the above documentation for accuracy and completeness, and I agree with the  above.  Brendolyn Patty MD

## 2019-09-20 NOTE — Addendum Note (Signed)
Addended by: Brendolyn Patty on: 09/20/2019 11:09 AM   Modules accepted: Level of Service

## 2019-09-27 ENCOUNTER — Other Ambulatory Visit: Payer: Self-pay | Admitting: Physician Assistant

## 2019-09-27 DIAGNOSIS — G43809 Other migraine, not intractable, without status migrainosus: Secondary | ICD-10-CM

## 2019-09-27 MED ORDER — SUMATRIPTAN SUCCINATE 50 MG PO TABS: 50.0000 mg | ORAL_TABLET | Freq: Once | ORAL | 0 refills | Status: DC

## 2019-11-05 ENCOUNTER — Encounter: Payer: Self-pay | Admitting: Obstetrics and Gynecology

## 2019-11-19 ENCOUNTER — Ambulatory Visit: Payer: Medicaid Other | Admitting: Obstetrics and Gynecology

## 2019-11-24 ENCOUNTER — Other Ambulatory Visit: Payer: Self-pay | Admitting: Physician Assistant

## 2019-11-24 DIAGNOSIS — G43809 Other migraine, not intractable, without status migrainosus: Secondary | ICD-10-CM

## 2019-11-26 ENCOUNTER — Other Ambulatory Visit: Payer: Self-pay | Admitting: Physician Assistant

## 2019-11-26 DIAGNOSIS — G43809 Other migraine, not intractable, without status migrainosus: Secondary | ICD-10-CM

## 2019-12-01 MED ORDER — SUMATRIPTAN SUCCINATE 50 MG PO TABS: 50.0000 mg | ORAL_TABLET | Freq: Once | ORAL | 0 refills | Status: DC

## 2020-01-13 ENCOUNTER — Encounter: Payer: Medicaid Other | Admitting: Dermatology

## 2020-03-16 ENCOUNTER — Other Ambulatory Visit: Payer: Self-pay | Admitting: Physician Assistant

## 2020-03-16 DIAGNOSIS — G43809 Other migraine, not intractable, without status migrainosus: Secondary | ICD-10-CM

## 2020-03-16 MED ORDER — SUMATRIPTAN SUCCINATE 50 MG PO TABS: 50.0000 mg | ORAL_TABLET | Freq: Once | ORAL | 0 refills | Status: DC

## 2020-05-22 ENCOUNTER — Encounter: Payer: Self-pay | Admitting: Physician Assistant

## 2020-05-24 ENCOUNTER — Telehealth (INDEPENDENT_AMBULATORY_CARE_PROVIDER_SITE_OTHER): Payer: Medicaid Other | Admitting: Physician Assistant

## 2020-05-24 DIAGNOSIS — R0982 Postnasal drip: Secondary | ICD-10-CM | POA: Diagnosis not present

## 2020-05-24 DIAGNOSIS — J029 Acute pharyngitis, unspecified: Secondary | ICD-10-CM | POA: Diagnosis not present

## 2020-05-24 NOTE — Patient Instructions (Signed)

## 2020-05-24 NOTE — Progress Notes (Signed)
MyChart Video Visit    Virtual Visit via Video Note   This visit type was conducted due to national recommendations for restrictions regarding the COVID-19 Pandemic (e.g. social distancing) in an effort to limit this patient's exposure and mitigate transmission in our community. This patient is at least at moderate risk for complications without adequate follow up. This format is felt to be most appropriate for this patient at this time. Physical exam was limited by quality of the video and audio technology used for the visit.   Patient location: Home Provider location: Office   I discussed the limitations of evaluation and management by telemedicine and the availability of in person appointments. The patient expressed understanding and agreed to proceed.  Patient: Diana Bautista   DOB: [birth year]   35 y.o. Female  MRN: [id number] Visit Date: 05/24/2020  Today's healthcare provider: Trinna Post, PA-C   Chief Complaint  Patient presents with  . Sinus Problem  I,Favor Kreh M Graeden Bitner,acting as a scribe for Trinna Post, PA-C.,have documented all relevant documentation on the behalf of Trinna Post, PA-C,as directed by  Trinna Post, PA-C while in the presence of Trinna Post, PA-C.  Subjective    Sinus Problem This is a new problem. The current episode started in the past 7 days. The problem is unchanged. There has been no fever. Her pain is at a severity of 0/10. She is experiencing no pain. Associated symptoms include congestion, coughing, ear pain, sinus pressure and a sore throat. Pertinent negatives include no chills, headaches, hoarse voice, shortness of breath or sneezing. Past treatments include acetaminophen (DAyquil, Mucinex). The treatment provided mild relief.    Patient reports possible strep, kids are home from school after strep outbreak. They themselves do not have strep. Patient denies white spots on her tonsils. She reports some redness and irritation  of tonsils. Reports she does feel better slightly today. Denies fevers, chills, SOB. She reports some cough in the morning, she reports a runny nose and some mild nausea. She normally does take claritin but stopped while she had COVID in 03/2020. She is feeling a bit better today.    Medications: Outpatient Medications Prior to Visit  Medication Sig  . clobetasol (TEMOVATE) 0.05 % external solution Apply 1 application topically as directed. Qd to bid aa scalp until clear, avoid face, groin, axilla  . levonorgestrel (LILETTA) 19.5 MCG/DAY IUD IUD by Intrauterine route.  . Norethindrone Acetate-Ethinyl Estrad-FE (BLISOVI 24 FE) 1-20 MG-MCG(24) tablet Take 1 tablet by mouth daily.  Marland Kitchen nystatin-triamcinolone (MYCOLOG II) cream Apply 1 application topically 2 (two) times daily.  . famotidine (PEPCID) 20 MG tablet Take 1 tablet (20 mg total) by mouth daily.  . fluconazole (DIFLUCAN) 150 MG tablet Take one pill day one and repeat three days later if symptomatic. (Patient not taking: Reported on 05/24/2020)  . SUMAtriptan (IMITREX) 50 MG tablet Take 1 tablet (50 mg total) by mouth once for 1 dose. May repeat in 2 hours if headache persists or recurs.  . [DISCONTINUED] doxycycline (MONODOX) 100 MG capsule Take 1 capsule (100 mg total) by mouth 2 (two) times daily. Take with food and drink (Patient not taking: Reported on 05/24/2020)   No facility-administered medications prior to visit.    Review of Systems  Constitutional: Negative for appetite change, chills, fatigue and fever.  HENT: Positive for congestion, ear pain, postnasal drip, rhinorrhea, sinus pressure, sinus pain and sore throat. Negative for hoarse voice and sneezing.   Respiratory:  Positive for cough. Negative for chest tightness, shortness of breath and wheezing.   Gastrointestinal: Positive for nausea. Negative for diarrhea and vomiting.  Neurological: Negative for dizziness, weakness and headaches.      Objective    There were no  vitals taken for this visit.   Physical Exam Constitutional:      Appearance: Normal appearance.  Pulmonary:     Effort: Pulmonary effort is normal. No respiratory distress.  Neurological:     Mental Status: She is alert.  Psychiatric:        Mood and Affect: Mood normal.        Behavior: Behavior normal.        Assessment & Plan    1. Sore throat  Advised NSAIDs and daily 2nd generation antihistamine/flonase daily.   2. PND (post-nasal drip)     Return if symptoms worsen or fail to improve.     I discussed the assessment and treatment plan with the patient. The patient was provided an opportunity to ask questions and all were answered. The patient agreed with the plan and demonstrated an understanding of the instructions.   The patient was advised to call back or seek an in-person evaluation if the symptoms worsen or if the condition fails to improve as anticipated.   ITrinna Post, PA-C, have reviewed all documentation for this visit. The documentation on 05/25/20 for the exam, diagnosis, procedures, and orders are all accurate and complete.  The entirety of the information documented in the History of Present Illness, Review of Systems and Physical Exam were personally obtained by me. Portions of this information were initially documented by [facility] and reviewed by me for thoroughness and accuracy.    Paulene Floor [facility] [phone] (phone) [phone] (fax)  [place]

## 2020-06-01 ENCOUNTER — Ambulatory Visit (INDEPENDENT_AMBULATORY_CARE_PROVIDER_SITE_OTHER): Payer: Medicaid Other | Admitting: Dermatology

## 2020-06-01 ENCOUNTER — Other Ambulatory Visit: Payer: Self-pay

## 2020-06-01 DIAGNOSIS — D239 Other benign neoplasm of skin, unspecified: Secondary | ICD-10-CM

## 2020-06-01 DIAGNOSIS — L719 Rosacea, unspecified: Secondary | ICD-10-CM

## 2020-06-01 DIAGNOSIS — L304 Erythema intertrigo: Secondary | ICD-10-CM | POA: Diagnosis not present

## 2020-06-01 DIAGNOSIS — D485 Neoplasm of uncertain behavior of skin: Secondary | ICD-10-CM | POA: Diagnosis not present

## 2020-06-01 DIAGNOSIS — L700 Acne vulgaris: Secondary | ICD-10-CM | POA: Diagnosis not present

## 2020-06-01 MED ORDER — HYDROCORTISONE 2.5 % EX CREA: TOPICAL_CREAM | Freq: Two times a day (BID) | CUTANEOUS | 1 refills | Status: DC | PRN

## 2020-06-01 MED ORDER — METRONIDAZOLE 0.75 % EX CREA: TOPICAL_CREAM | Freq: Two times a day (BID) | CUTANEOUS | 2 refills | Status: DC

## 2020-06-01 MED ORDER — MUPIROCIN 2 % EX OINT: 1.0000 "application " | TOPICAL_OINTMENT | Freq: Every day | CUTANEOUS | 0 refills | Status: DC

## 2020-06-01 MED ORDER — KETOCONAZOLE 2 % EX CREA: 1.0000 "application " | TOPICAL_CREAM | Freq: Two times a day (BID) | CUTANEOUS | 1 refills | Status: AC

## 2020-06-01 MED ORDER — AZELAIC ACID 15 % EX GEL: 1.0000 "application " | Freq: Two times a day (BID) | CUTANEOUS | 2 refills | Status: DC

## 2020-06-01 NOTE — Patient Instructions (Addendum)
Wound Care Instructions  1. Cleanse wound gently with soap and water once a day then pat dry with clean gauze. Apply a thing coat of Petrolatum (petroleum jelly, "Vaseline") over the wound (unless you have an allergy to this). We recommend that you use a new, sterile tube of Vaseline. Do not pick or remove scabs. Do not remove the yellow or white "healing tissue" from the base of the wound.  2. Cover the wound with fresh, clean, nonstick gauze and secure with paper tape. You may use Band-Aids in place of gauze and tape if the would is small enough, but would recommend trimming much of the tape off as there is often too much. Sometimes Band-Aids can irritate the skin.  3. You should call the office for your biopsy report after 1 week if you have not already been contacted.  4. If you experience any problems, such as abnormal amounts of bleeding, swelling, significant bruising, significant pain, or evidence of infection, please call the office immediately.   Some Recommended Sunscreens Include:  Good for Daily Wear (feels like lotion but NOT sweat resistant) Cerave AM Moisturizer with SPF EltaMD UV Lotion  Body or All Over Sunscreen EltaMD UV active for body and face Blue lizard sensitive Sun bum mineral (avoid if sensitive to scent) Aveeno Positively Mineral Neutrogena sheer zinc (Slightly harder to rub in) [employer]S clear zinc (Slightly harder to rub in)  Clear Face Sunscreen EltaMD UV Elements CeraVe hydrating sunscreen 50 face  Tinted Face Sunscreen Alastin Hydratint (good for most skin tones, may be slightly dark if you are very fair) Colorescience Sunforgettable Total Protection Face Shield (good for most skin tones) EltaMD UV Physical La Roche Posay Mineral Tinted Cotz Flawless Complexion   Powder Sunscreen (Nice for reapplying or applying on the go) Colorescience Sunforgettable Total Protection Brush on Shield (available in different tints)  Face Sunscreen Available in  Different Tints Colorescience Sunforgettable Total Protection Brush on Shield  bareMinerals Complexion Rescue Tinted Hydrating Gel Cream Broad Spectrum SPF 30 UnSun mineral tinted (comes in medium/dark and light/medium)  Kids (over 6 months) - Mineral Sunscreens Recommended eltaMD UV Pure MDsolarSciences KidStick 40 SPF Aveeno Baby Continuous Protection Sensitive Zinc Oxide Blue Lizard Kids mineral based sunscreen lotion Mustela Mineral Sunscreen for face and body Neutrogena Sheer Zinc Kids Sunscreen Stick  Tinted to look like a tan PCAskin sheer tint body spray  Rosacea  What is rosacea? Rosacea (say: ro-zay-sha) is a common skin disease that usually begins as a trend of flushing or blushing easily.  As rosacea progresses, a persistent redness in the center of the face will develop and may gradually spread beyond the nose and cheeks to the forehead and chin.  In some cases, the ears, chest, and back could be affected.  Rosacea may appear as tiny blood vessels or small red bumps that occur in crops.  Frequently they can contain pus, and are called "pustules".  If the bumps do not contain pus, they are referred to as "papules".  Rarely, in prolonged, untreated cases of rosacea, the oil glands of the nose and cheeks may become permanently enlarged.  This is called rhinophyma, and is seen more frequently in men.  Signs and Risks In its beginning stages, rosacea tends to come and go, which makes it difficult to recognize.  It can start as intermittent flushing of the face.  Eventually, blood vessels may become permanently visible.  Pustules and papules can appear, but can be mistaken for adult acne.  People of  all races, ages, genders and ethnic groups are at risk of developing rosacea.  However, it is more common in women (especially around menopause) and adults with fair skin between the ages of 29 and 53.  Treatment Dermatologists typically recommend a combination of treatments to effectively  manage rosacea.  Treatment can improve symptoms and may stop the progression of the rosacea.  Treatment may involve both topical and oral medications.  The tetracycline antibiotics are often used for their anti-inflammatory effect; however, because of the possibility of developing antibiotic resistance, they should not be used long term at full dose.  For dilated blood vessels the options include electrodessication (uses electric current through a small needle), laser treatment, and cosmetics to hide the redness.   With all forms of treatment, improvement is a slow process, and patients may not see any results for the first 3-4 weeks.  It is very important to avoid the sun and other triggers.  Patients must wear sunscreen daily.  Skin Care Instructions: 1. Cleanse the skin with a mild soap such as CeraVe cleanser, Cetaphil cleanser, or Dove soap once or twice daily as needed. 2. Moisturize with Eucerin Redness Relief Daily Perfecting Lotion (has a subtle green tint), CeraVe Moisturizing Cream, or Oil of Olay Daily Moisturizer with sunscreen every morning and/or night as recommended. 3. Makeup should be "non-comedogenic" (won't clog pores) and be labeled "for sensitive skin". Good choices for cosmetics are: Neutrogena, Almay, and Physician's Formula.  Any product with a green tint tends to offset a red complexion. 4. If your eyes are dry and irritated, use artificial tears 2-3 times per day and cleanse the eyelids daily with baby shampoo.  Have your eyes examined at least every 2 years.  Be sure to tell your eye doctor that you have rosacea. 5. Alcoholic beverages tend to cause flushing of the skin, and may make rosacea worse. 6. Always wear sunscreen, protect your skin from extreme hot and cold temperatures, and avoid spicy foods, hot drinks, and mechanical irritation such as rubbing, scrubbing, or massaging the face.  Avoid harsh skin cleansers, cleansing masks, astringents, and exfoliation. If a  particular product burns or makes your face feel tight, then it is likely to flare your rosacea. 7. If you are having difficulty finding a sunscreen that you can tolerate, you may try switching to a chemical-free sunscreen.  These are ones whose active ingredient is zinc oxide or titanium dioxide only.  They should also be fragrance free, non-comedogenic, and labeled for sensitive skin. 8. Rosacea triggers may vary from person to person.  There are a variety of foods that have been reported to trigger rosacea.  Some patients find that keeping a diary of what they were doing when they flared helps them avoid triggers.  Do not pick up metronidazole.  Recommended non-comedogenic (non-acne causing) facial oils include 100% argan oil or squalane. The can be used after applying any recommended creams or ointments to the skin in the evening. The Ordinary Brand has a high-quality and affordable version of both of these and can be found at [place].  Your medications have been sent to [facility] and will be mailed to you after you call them and confirm your information with them.   [place] [phone] [address], [place], [place] [address]

## 2020-06-01 NOTE — Progress Notes (Signed)
   Follow-Up Visit   Subjective  Diana Bautista is a 35 y.o. female who presents for the following: FBSE (Patient here for full body skin exam and skin cancer screening. 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 Patient advises it has come back and is larger. No hx of skin cancer or atypical moles. ).  The following portions of the chart were reviewed this encounter and updated as appropriate:   Tobacco  Allergies  Meds  Problems  Med Hx  Surg Hx  Fam Hx      Review of Systems:  No other skin or systemic complaints except as noted in HPI or Assessment and Plan.  Objective  Well appearing patient in no apparent distress; mood and affect are within normal limits.   Objective  Left posterior ankle: 0.5cm purple papule     Objective  axillae: Erythematous macerated patches  Objective  face: Trace open comedones  Objective  Head - Anterior (Face): Mid face erythema with inflammatory papules    Assessment & Plan  Neoplasm of uncertain behavior of skin Left posterior ankle  Epidermal / dermal shaving  Lesion diameter (cm):  0.5 Informed consent: discussed and consent obtained   Timeout: patient name, date of birth, surgical site, and procedure verified   Patient was prepped and draped in usual sterile fashion: area prepped with isopropyl alcohol. Anesthesia: the lesion was anesthetized in a standard fashion   Anesthetic:  1% lidocaine w/ epinephrine 1-100,000 buffered w/ 8.4% NaHCO3 Instrument used: flexible razor blade   Hemostasis achieved with: aluminum chloride   Outcome: patient tolerated procedure well   Post-procedure details: wound care instructions given   Additional details:  Mupirocin and a bandage applied  Specimen 1 - Surgical pathology Differential Diagnosis: r/o Irritated hemangioma vs other  Check Margins: No 0.5cm purple papule [id number]  Ordered Medications: mupirocin ointment (BACTROBAN) 2  %  Erythema intertrigo axillae  Start ketoconazole twice a day prn  Start HC 2.5% cream twice a day prn up to 1 week    Ordered Medications: ketoconazole (NIZORAL) 2 % cream hydrocortisone 2.5 % cream  Acne vulgaris face  Chronic, flared.  Will defer retinoid given sensitivity with rosacea.   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 Condition is bothersome to patient. Currently flared.  Worsens with menses  Start azelaic acid 15% twice daily for rosacea and acne overlap  Rosacea is a chronic progressive skin condition usually affecting the face of adults, causing redness and/or acne bumps. It is treatable but not curable. It sometimes affects the eyes (ocular rosacea) as well. It may respond to topical and/or systemic medication and can flare with stress, sun exposure, alcohol, exercise and some foods.  Daily application of broad spectrum spf 30+ sunscreen to face is recommended to reduce flares.   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  Graciella Belton, RMA, am acting as scribe for Forest Gleason, MD .  Documentation: I have reviewed the above documentation for accuracy and completeness, and I agree with the above.  Forest Gleason, MD

## 2020-06-04 ENCOUNTER — Encounter: Payer: Self-pay | Admitting: Dermatology

## 2020-06-04 MED ORDER — AZELAIC ACID 15 % EX GEL: CUTANEOUS | 2 refills | Status: DC

## 2020-08-08 DIAGNOSIS — J069 Acute upper respiratory infection, unspecified: Secondary | ICD-10-CM | POA: Diagnosis not present

## 2020-08-24 ENCOUNTER — Ambulatory Visit: Payer: Medicaid Other | Admitting: Dermatology

## 2020-10-10 ENCOUNTER — Other Ambulatory Visit: Payer: Self-pay

## 2020-10-10 ENCOUNTER — Ambulatory Visit: Payer: Medicaid Other | Admitting: Dermatology

## 2020-10-10 DIAGNOSIS — L918 Other hypertrophic disorders of the skin: Secondary | ICD-10-CM | POA: Diagnosis not present

## 2020-10-10 DIAGNOSIS — L578 Other skin changes due to chronic exposure to nonionizing radiation: Secondary | ICD-10-CM

## 2020-10-10 DIAGNOSIS — L700 Acne vulgaris: Secondary | ICD-10-CM | POA: Diagnosis not present

## 2020-10-10 DIAGNOSIS — D485 Neoplasm of uncertain behavior of skin: Secondary | ICD-10-CM | POA: Diagnosis not present

## 2020-10-10 DIAGNOSIS — L719 Rosacea, unspecified: Secondary | ICD-10-CM

## 2020-10-10 MED ORDER — METRONIDAZOLE 0.75 % EX CREA: TOPICAL_CREAM | Freq: Two times a day (BID) | CUTANEOUS | 0 refills | Status: DC

## 2020-10-10 MED ORDER — TRETINOIN 0.025 % EX CREA: TOPICAL_CREAM | Freq: Every day | CUTANEOUS | 0 refills | Status: DC

## 2020-10-10 NOTE — Patient Instructions (Addendum)
Recommended non-comedogenic (non-acne causing) facial oils include 100% argan oil or squalane. The can be used after applying any recommended creams or ointments to the skin in the evening. The Ordinary Brand has a high-quality and affordable version of both of these and can be found at [place].  Topical retinoid medications like tretinoin/Retin-A, adapalene/Differin, tazarotene/Fabior, and Epiduo/Epiduo Forte can cause dryness and irritation when first started. Only apply a pea-sized amount to the entire affected area. Avoid applying it around the eyes, edges of mouth and creases at the nose. If you experience irritation, use a good moisturizer first and/or apply the medicine less often. If you are doing well with the medicine, you can increase how often you use it until you are applying every night. Be careful with sun protection while using this medication as it can make you sensitive to the sun. This medicine should not be used by pregnant women.   Recommend daily broad spectrum sunscreen SPF 30+ to sun-exposed areas, reapply every 2 hours as needed. Call for new or changing lesions.  Staying in the shade or wearing long sleeves, sun glasses (UVA+UVB protection) and wide brim hats (4-inch brim around the entire circumference of the hat) are also recommended for sun protection.   Recommend taking Heliocare sun protection supplement daily in sunny weather for additional sun protection. For maximum protection on the sunniest days, you can take up to 2 capsules of regular Heliocare OR take 1 capsule of Heliocare Ultra. For prolonged exposure (such as a full day in the sun), you can repeat your dose of the supplement 4 hours after your first dose. Heliocare can be purchased at [facility] or at [email or web address].    If you have any questions or concerns for your doctor, please call our main line at [phone] and press option 4 to reach your doctor's medical assistant. If no one answers,  please leave a voicemail as directed and we will return your call as soon as possible. Messages left after 4 pm will be answered the following business day.   You may also send [place] a message via [place]. We typically respond to MyChart messages within 1-2 business days.  For prescription refills, please ask your pharmacy to contact our office. Our fax number is [phone].  If you have an urgent issue when the clinic is closed that cannot wait until the next business day, you can page your doctor at the number below.    Please note that while we do our best to be available for urgent issues outside of office hours, we are not available 24/7.   If you have an urgent issue and are unable to reach [place], you may choose to seek medical care at your doctor's office, retail clinic, urgent care center, or emergency room.  If you have a medical emergency, please immediately call 911 or go to the emergency department.  Pager Numbers  - Dr. Nehemiah Massed: [phone]  - Dr. Laurence Ferrari: [phone]  - Dr. Nicole Kindred: [phone]  In the event of inclement weather, please call our main line at [phone] for an update on the status of any delays or closures.  Dermatology Medication Tips: Please keep the boxes that topical medications come in in order to help keep track of the instructions about where and how to use these. Pharmacies typically print the medication instructions only on the boxes and not directly on the medication tubes.   If your medication is too expensive, please contact our office at [phone] option 4  or send [place] a message through [place].   We are unable to tell what your co-pay for medications will be in advance as this is different depending on your insurance coverage. However, we may be able to find a substitute medication at lower cost or fill out paperwork to get insurance to cover a needed medication.   If a prior authorization is required to get your medication covered by your  insurance company, please allow [place] 1-2 business days to complete this process.  Drug prices often vary depending on where the prescription is filled and some pharmacies may offer cheaper prices.  The website www.goodrx.com contains coupons for medications through different pharmacies. The prices here do not account for what the cost may be with help from insurance (it may be cheaper with your insurance), but the website can give you the price if you did not use any insurance.  - You can print the associated coupon and take it with your prescription to the pharmacy.  - You may also stop by our office during regular business hours and pick up a GoodRx coupon card.  - If you need your prescription sent electronically to a different pharmacy, notify our office through [facility] or by phone at [phone] option 4.

## 2020-10-10 NOTE — Progress Notes (Signed)
Follow-Up Visit   Subjective  Diana Bautista is a 35 y.o. female who presents for the following: Rosacea (Patient here today for 4 month rosacea follow up. She used azelaic acid for a few weeks but got very dry. Patient only using over the counter products today) and Skin Tag (Patient has 2 spots at left side that are irritated from bra. ).  The following portions of the chart were reviewed this encounter and updated as appropriate:   Tobacco  Allergies  Meds  Problems  Med Hx  Surg Hx  Fam Hx      Review of Systems:  No other skin or systemic complaints except as noted in HPI or Assessment and Plan.  Objective  Well appearing patient in no apparent distress; mood and affect are within normal limits.  A focused examination was performed including face, trunk. Relevant physical exam findings are noted in the Assessment and Plan.  Head - Anterior (Face) Mid face erythema and rare inflammatory papule  Head - Anterior (Face) Trace open comedones, rare inflammatory papule at face  Left abdomen medial 0.4cm erythematous tan papule R/o irritated acrochordon vs irritated nevus or other     Left abdomen lateral 0.3cm purple papule R/o irritated acrochordon vs irritated nevus or other   Assessment & Plan  Rosacea Head - Anterior (Face)  Chronic condition with duration or expected duration over one year. Condition is bothersome to patient. Currently flared.  Rosacea is a chronic progressive skin condition usually affecting the face of adults, causing redness and/or acne bumps. It is treatable but not curable. It sometimes affects the eyes (ocular rosacea) as well. It may respond to topical and/or systemic medication and can flare with stress, sun exposure, alcohol, exercise and some foods.  Daily application of broad spectrum spf 30+ sunscreen to face is recommended to reduce flares.  Start metronidazole 0.75% cream bid to mid face.    metroNIDAZOLE (METROCREAM) 0.75 %  cream - Head - Anterior (Face) Apply topically 2 (two) times daily.  Acne vulgaris Head - Anterior (Face)  Chronic condition with duration or expected duration over one year. Condition is bothersome to patient. Not currently at goal.  Start tretinoin 0.025% cream qhs as tolerated.   Recommended non-comedogenic (non-acne causing) facial oils include 100% argan oil or squalane. The can be used after applying any recommended creams or ointments to the skin in the evening. [employer] has a high-quality and affordable version of both of these and can be found at [place].  Topical retinoid medications like tretinoin/Retin-A, adapalene/Differin, tazarotene/Fabior, and Epiduo/Epiduo Forte can cause dryness and irritation when first started. Only apply a pea-sized amount to the entire affected area. Avoid applying it around the eyes, edges of mouth and creases at the nose. If you experience irritation, use a good moisturizer first and/or apply the medicine less often. If you are doing well with the medicine, you can increase how often you use it until you are applying every night. Be careful with sun protection while using this medication as it can make you sensitive to the sun. This medicine should not be used by pregnant women.    tretinoin (RETIN-A) 0.025 % cream - Head - Anterior (Face) Apply topically at bedtime.  Neoplasm of uncertain behavior of skin (2) Left abdomen medial  Skin / nail biopsy Type of biopsy: tangential   Informed consent: discussed and consent obtained   Timeout: patient name, date of birth, surgical site, and procedure verified  Patient was prepped and draped in usual sterile fashion: Area prepped with isopropyl alcohol. Anesthesia: the lesion was anesthetized in a standard fashion   Local anesthetic: 0.75% bupivicaine. Instrument used: scissors   Hemostasis achieved with: aluminum chloride   Outcome: patient tolerated procedure well   Post-procedure  details: wound care instructions given   Additional details:  Mupirocin and a bandage applied  Specimen 1 - Surgical pathology Differential Diagnosis: R/o irritated acrochordon vs irritated nevus or other  Check Margins: No 0.4cm erythematous tan papule   Left abdomen lateral  Skin / nail biopsy Type of biopsy: tangential   Informed consent: discussed and consent obtained   Timeout: patient name, date of birth, surgical site, and procedure verified   Patient was prepped and draped in usual sterile fashion: Area prepped with isopropyl alcohol. Anesthesia: the lesion was anesthetized in a standard fashion   Local anesthetic: 0.75% bupivicaine. Instrument used: scissors   Hemostasis achieved with: aluminum chloride   Outcome: patient tolerated procedure well   Post-procedure details: wound care instructions given   Additional details:  Mupirocin and a bandage applied  Specimen 2 - Surgical pathology Differential Diagnosis: R/o irritated acrochordon vs irritated nevus or other  Check Margins: No 0.3cm purple papule  Actinic Damage - chronic, secondary to cumulative UV radiation exposure/sun exposure over time - diffuse scaly erythematous macules with underlying dyspigmentation - Recommend daily broad spectrum sunscreen SPF 30+ to sun-exposed areas, reapply every 2 hours as needed.  - Recommend staying in the shade or wearing long sleeves, sun glasses (UVA+UVB protection) and wide brim hats (4-inch brim around the entire circumference of the hat). - Call for new or changing lesions.  Return in about 3 months (around 01/10/2021) for Acne, Rosacea.  Graciella Belton, RMA, am acting as scribe for Forest Gleason, MD .  Documentation: I have reviewed the above documentation for accuracy and completeness, and I agree with the above.  Forest Gleason, MD

## 2020-10-18 ENCOUNTER — Encounter: Payer: Self-pay | Admitting: Dermatology

## 2020-10-19 ENCOUNTER — Encounter: Payer: Self-pay | Admitting: Dermatology

## 2020-10-19 ENCOUNTER — Other Ambulatory Visit: Payer: Self-pay

## 2020-10-19 DIAGNOSIS — L719 Rosacea, unspecified: Secondary | ICD-10-CM

## 2020-10-19 MED ORDER — METROCREAM 0.75 % EX CREA
TOPICAL_CREAM | Freq: Two times a day (BID) | CUTANEOUS | 1 refills | Status: DC
Start: 2020-10-19 — End: 2021-01-10

## 2020-10-19 NOTE — Progress Notes (Signed)
me

## 2021-01-10 ENCOUNTER — Encounter: Payer: Self-pay | Admitting: Dermatology

## 2021-01-10 ENCOUNTER — Ambulatory Visit: Payer: Medicaid Other | Admitting: Dermatology

## 2021-01-10 ENCOUNTER — Other Ambulatory Visit: Payer: Self-pay

## 2021-01-10 DIAGNOSIS — L988 Other specified disorders of the skin and subcutaneous tissue: Secondary | ICD-10-CM

## 2021-01-10 DIAGNOSIS — L700 Acne vulgaris: Secondary | ICD-10-CM

## 2021-01-10 DIAGNOSIS — L719 Rosacea, unspecified: Secondary | ICD-10-CM | POA: Diagnosis not present

## 2021-01-10 MED ORDER — METROCREAM 0.75 % EX CREA: TOPICAL_CREAM | Freq: Two times a day (BID) | CUTANEOUS | 1 refills | Status: DC

## 2021-01-10 MED ORDER — DIFFERIN 0.3 % EX GEL: CUTANEOUS | 0 refills | Status: DC

## 2021-01-10 NOTE — Patient Instructions (Addendum)
Rosacea is a chronic progressive skin condition usually affecting the face of adults, causing redness and/or acne bumps. It is treatable but not curable. It sometimes affects the eyes (ocular rosacea) as well. It may respond to topical and/or systemic medication and can flare with stress, sun exposure, alcohol, exercise and some foods.  Daily application of broad spectrum spf 30+ sunscreen to face is recommended to reduce flares.  Start Metrocream twice daily Start doxycycline 20 mg twice daily with food.   Doxycycline should be taken with food to prevent nausea. Do not lay down for 30 minutes after taking. Be cautious with sun exposure and use good sun protection while on this medication. Pregnant women should not take this medication.   Discontinue tretinoin  Start Differin 0.3% gel at bedtime.   Topical retinoid medications like tretinoin/Retin-A, adapalene/Differin, tazarotene/Fabior, and Epiduo/Epiduo Forte can cause dryness and irritation when first started. Only apply a pea-sized amount to the entire affected area. Avoid applying it around the eyes, edges of mouth and creases at the nose. If you experience irritation, use a good moisturizer first and/or apply the medicine less often. If you are doing well with the medicine, you can increase how often you use it until you are applying every night. Be careful with sun protection while using this medication as it can make you sensitive to the sun. This medicine should not be used by pregnant women.   If you have any questions or concerns for your doctor, please call our main line at [phone] and press option 4 to reach your doctor's medical assistant. If no one answers, please leave a voicemail as directed and we will return your call as soon as possible. Messages left after 4 pm will be answered the following business day.   You may also send [place] a message via [place]. We typically respond to MyChart messages within 1-2 business days.  For  prescription refills, please ask your pharmacy to contact our office. Our fax number is [phone].  If you have an urgent issue when the clinic is closed that cannot wait until the next business day, you can page your doctor at the number below.    Please note that while we do our best to be available for urgent issues outside of office hours, we are not available 24/7.   If you have an urgent issue and are unable to reach [place], you may choose to seek medical care at your doctor's office, retail clinic, urgent care center, or emergency room.  If you have a medical emergency, please immediately call 911 or go to the emergency department.  Pager Numbers  - Dr. Nehemiah Massed: [phone]  - Dr. Laurence Ferrari: [phone]  - Dr. Nicole Kindred: [phone]  In the event of inclement weather, please call our main line at [phone] for an update on the status of any delays or closures.  Dermatology Medication Tips: Please keep the boxes that topical medications come in in order to help keep track of the instructions about where and how to use these. Pharmacies typically print the medication instructions only on the boxes and not directly on the medication tubes.   If your medication is too expensive, please contact our office at [phone] option 4 or send [place] a message through [place].   We are unable to tell what your co-pay for medications will be in advance as this is different depending on your insurance coverage. However, we may be able to find a substitute medication at lower cost or fill out  paperwork to get insurance to cover a needed medication.   If a prior authorization is required to get your medication covered by your insurance company, please allow [place] 1-2 business days to complete this process.  Drug prices often vary depending on where the prescription is filled and some pharmacies may offer cheaper prices.  The website www.goodrx.com contains coupons for medications through different  pharmacies. The prices here do not account for what the cost may be with help from insurance (it may be cheaper with your insurance), but the website can give you the price if you did not use any insurance.  - You can print the associated coupon and take it with your prescription to the pharmacy.  - You may also stop by our office during regular business hours and pick up a GoodRx coupon card.  - If you need your prescription sent electronically to a different pharmacy, notify our office through [facility] or by phone at [phone] option 4.

## 2021-01-10 NOTE — Progress Notes (Signed)
Follow-Up Visit   Subjective  Diana Bautista is a 35 y.o. female who presents for the following: Follow-up (Patient here today for acne and rosacea follow up. Patient currently using tretinoin 0.025% cream a few times weekly. Patient advises she can't use it more often due to dryness. She did not get the metrocream 0.75% from pharmacy. Rosacea is not bothersome for patient but she is still breaking out, mostly with periods. ).  The following portions of the chart were reviewed this encounter and updated as appropriate:   Tobacco  Allergies  Meds  Problems  Med Hx  Surg Hx  Fam Hx      Review of Systems:  No other skin or systemic complaints except as noted in HPI or Assessment and Plan.  Objective  Well appearing patient in no apparent distress; mood and affect are within normal limits.  A focused examination was performed including face, chest, back. Relevant physical exam findings are noted in the Assessment and Plan.  face Scattered erythematous papules at mid face  face Trace open comedones, rare inflammatory papule at jaw Chest with rare inflammatory papule Back with few inflammatory papules  face Rhytides and volume loss.    Assessment & Plan  Rosacea face  Chronic condition with duration or expected duration over one year. Condition is bothersome to patient. Currently flared.  Rosacea is a chronic progressive skin condition usually affecting the face of adults, causing redness and/or acne bumps. It is treatable but not curable. It sometimes affects the eyes (ocular rosacea) as well. It may respond to topical and/or systemic medication and can flare with stress, sun exposure, alcohol, exercise and some foods.  Daily application of broad spectrum spf 30+ sunscreen to face is recommended to reduce flares.  Start Metrocream twice daily Start doxycycline 20 mg twice daily with food.   Doxycycline should be taken with food to prevent nausea. Do not lay down for 30  minutes after taking. Be cautious with sun exposure and use good sun protection while on this medication. Pregnant women should not take this medication.    Related Medications METROCREAM 0.75 % cream Apply topically 2 (two) times daily.  Acne vulgaris face  Chronic condition with duration or expected duration over one year. Condition is bothersome to patient. Not currently at goal, and getting too irritated from tretinoin.  Discontinue tretinoin  Start Differin 0.3% gel at bedtime.   Topical retinoid medications like tretinoin/Retin-A, adapalene/Differin, tazarotene/Fabior, and Epiduo/Epiduo Forte can cause dryness and irritation when first started. Only apply a pea-sized amount to the entire affected area. Avoid applying it around the eyes, edges of mouth and creases at the nose. If you experience irritation, use a good moisturizer first and/or apply the medicine less often. If you are doing well with the medicine, you can increase how often you use it until you are applying every night. Be careful with sun protection while using this medication as it can make you sensitive to the sun. This medicine should not be used by pregnant women.   Start doxycycline 20 mg twice a day as above  Doxycycline should be taken with food to prevent nausea. Do not lay down for 30 minutes after taking. Be cautious with sun exposure and use good sun protection while on this medication. Pregnant women should not take this medication.   Discussed adding spironolactone given hormonal component at follow-up if she is not staying clear with topicals after a few months on doxycycline.   DIFFERIN 0.3 %  gel - face Apply to face at bedtime  Elastosis of skin face  Discussed fillers around mouth, lips  Recommend 23 units Botox for frown complex, forehead and 4 units for brow lift.   Return for Acne, Rosacea 2-3 months.  Graciella Belton, RMA, am acting as scribe for Forest Gleason, MD .  Documentation: I  have reviewed the above documentation for accuracy and completeness, and I agree with the above.  Forest Gleason, MD

## 2021-01-11 ENCOUNTER — Encounter: Payer: Self-pay | Admitting: Dermatology

## 2021-01-11 DIAGNOSIS — L700 Acne vulgaris: Secondary | ICD-10-CM

## 2021-01-12 ENCOUNTER — Other Ambulatory Visit: Payer: Self-pay

## 2021-01-12 DIAGNOSIS — L719 Rosacea, unspecified: Secondary | ICD-10-CM

## 2021-01-12 DIAGNOSIS — L700 Acne vulgaris: Secondary | ICD-10-CM

## 2021-01-12 MED ORDER — DIFFERIN 0.3 % EX GEL: CUTANEOUS | 1 refills | Status: DC

## 2021-01-12 MED ORDER — DOXYCYCLINE HYCLATE 20 MG PO TABS: 20.0000 mg | ORAL_TABLET | Freq: Two times a day (BID) | ORAL | 1 refills | Status: AC

## 2021-01-12 MED ORDER — METROCREAM 0.75 % EX CREA: TOPICAL_CREAM | Freq: Two times a day (BID) | CUTANEOUS | 1 refills | Status: DC

## 2021-01-19 MED ORDER — DIFFERIN 0.3 % EX GEL: CUTANEOUS | 1 refills | Status: DC

## 2021-02-12 ENCOUNTER — Encounter: Payer: Self-pay | Admitting: Dermatology

## 2021-02-12 NOTE — Telephone Encounter (Signed)
Ok to send triamcinolone 0.1% cream twice a day as needed up to 2 weeks and ketoconazole cream (a different antifungal) to use twice a day until rash is clear. Thank you!

## 2021-02-12 NOTE — Telephone Encounter (Signed)
Please advise Diana Bautista to seek medical care if rash worsens or if it's not clearing in 1-2 weeks. Thanks!

## 2021-02-13 ENCOUNTER — Other Ambulatory Visit: Payer: Self-pay

## 2021-02-13 MED ORDER — TRIAMCINOLONE ACETONIDE 0.1 % EX CREA: 1.0000 "application " | TOPICAL_CREAM | Freq: Two times a day (BID) | CUTANEOUS | 0 refills | Status: DC | PRN

## 2021-02-13 MED ORDER — KETOCONAZOLE 2 % EX CREA: 1.0000 "application " | TOPICAL_CREAM | Freq: Two times a day (BID) | CUTANEOUS | 0 refills | Status: AC

## 2021-02-13 NOTE — Progress Notes (Signed)
Ketoconazole 2% cream and TMC 0.1% cream sent in for patient./js

## 2021-03-13 ENCOUNTER — Ambulatory Visit: Payer: Medicaid Other | Admitting: Dermatology

## 2021-04-17 ENCOUNTER — Ambulatory Visit: Payer: Medicaid Other | Admitting: Dermatology

## 2021-04-17 ENCOUNTER — Encounter: Payer: Self-pay | Admitting: Dermatology

## 2021-04-17 ENCOUNTER — Other Ambulatory Visit: Payer: Self-pay

## 2021-04-17 DIAGNOSIS — L700 Acne vulgaris: Secondary | ICD-10-CM | POA: Diagnosis not present

## 2021-04-17 MED ORDER — WINLEVI 1 % EX CREA: TOPICAL_CREAM | CUTANEOUS | 5 refills | Status: DC

## 2021-04-17 NOTE — Progress Notes (Signed)
° °  Follow-Up Visit   Subjective  Raeli A Waters is a 36 y.o. female who presents for the following: Acne (Not taking Doxycycline 20mg  at this time, finished Rx. Was better controlled whiled taking Doxy. Using Metronidazole 0.75% cream every other day. Using Tretinoin once a week. Has not been using Differin. Recent flare secondary to menses).  The following portions of the chart were reviewed this encounter and updated as appropriate:  Tobacco   Allergies   Meds   Problems   Med Hx   Surg Hx   Fam Hx       Review of Systems: No other skin or systemic complaints except as noted in HPI or Assessment and Plan.   Objective  Well appearing patient in no apparent distress; mood and affect are within normal limits.  A focused examination was performed including face, neck. Relevant physical exam findings are noted in the Assessment and Plan.  face Face with trace open comedones, scattered inflammatory papules. Chest and back clear.   Assessment & Plan  Acne vulgaris face  Acne and Rosacea overlap Chronic condition with duration or expected duration over one year. Condition is bothersome to patient. Currently flared.   Start Winlevi cream twice daily.  Continue Metronidazole 0.75% cream as directed. Okay to stop Tretinoin at this time.   If Nigel Bridgeman is not covered with plan topical clindamycin with Hypochlorous spray.   Discussed Spironolactone given hormonal component, patient defers at this time.   Clascoterone (WINLEVI) 1 % CREA - face Apply twice daily to face   Return for Acne Follow Up 4-6 months.  I, Emelia Salisbury, CMA, am acting as scribe for Forest Gleason, MD.  Documentation: I have reviewed the above documentation for accuracy and completeness, and I agree with the above.  Forest Gleason, MD

## 2021-04-17 NOTE — Patient Instructions (Addendum)
Start Winlevi cream twice daily.  Continue Metronidazole 0.75% cream as directed. Okay to stop Tretinoin at this time.   If You Need Anything After Your Visit  If you have any questions or concerns for your doctor, please call our main line at [phone] and press option 4 to reach your doctor's medical assistant. If no one answers, please leave a voicemail as directed and we will return your call as soon as possible. Messages left after 4 pm will be answered the following business day.   You may also send [place] a message via [place]. We typically respond to MyChart messages within 1-2 business days.  For prescription refills, please ask your pharmacy to contact our office. Our fax number is [phone].  If you have an urgent issue when the clinic is closed that cannot wait until the next business day, you can page your doctor at the number below.    Please note that while we do our best to be available for urgent issues outside of office hours, we are not available 24/7.   If you have an urgent issue and are unable to reach [place], you may choose to seek medical care at your doctor's office, retail clinic, urgent care center, or emergency room.  If you have a medical emergency, please immediately call 911 or go to the emergency department.  Pager Numbers  - Dr. Nehemiah Massed: [phone]  - Dr. Laurence Ferrari: [phone]  - Dr. Nicole Kindred: [phone]  In the event of inclement weather, please call our main line at [phone] for an update on the status of any delays or closures.  Dermatology Medication Tips: Please keep the boxes that topical medications come in in order to help keep track of the instructions about where and how to use these. Pharmacies typically print the medication instructions only on the boxes and not directly on the medication tubes.   If your medication is too expensive, please contact our office at [phone] option 4 or send [place] a message through [place].   We are  unable to tell what your co-pay for medications will be in advance as this is different depending on your insurance coverage. However, we may be able to find a substitute medication at lower cost or fill out paperwork to get insurance to cover a needed medication.   If a prior authorization is required to get your medication covered by your insurance company, please allow [place] 1-2 business days to complete this process.  Drug prices often vary depending on where the prescription is filled and some pharmacies may offer cheaper prices.  The website www.goodrx.com contains coupons for medications through different pharmacies. The prices here do not account for what the cost may be with help from insurance (it may be cheaper with your insurance), but the website can give you the price if you did not use any insurance.  - You can print the associated coupon and take it with your prescription to the pharmacy.  - You may also stop by our office during regular business hours and pick up a GoodRx coupon card.  - If you need your prescription sent electronically to a different pharmacy, notify our office through [facility] or by phone at [phone] option 4.     Si Usted Necesita Algo Despus de Su Visita  Tambin puede enviarnos un mensaje a travs de Pharmacist, community. Por lo general respondemos a los mensajes de MyChart en el transcurso de 1 a 2 das hbiles.  Para renovar recetas, por favor pida  a su farmacia que se ponga en contacto con nuestra oficina. Harland Dingwall de fax es [place] [phone].  Si tiene un asunto urgente cuando la clnica est cerrada y que no puede esperar hasta el siguiente da hbil, puede llamar/localizar a su doctor(a) al nmero que aparece a continuacin.   Por favor, tenga en cuenta que aunque hacemos todo lo posible para estar disponibles para asuntos urgentes fuera del horario de [place], no estamos disponibles las 24 horas del da, los 7 das de la [place].   Si tiene un  problema urgente y no puede comunicarse con nosotros, puede optar por buscar atencin mdica  en el consultorio de su doctor(a), en una clnica privada, en un centro de atencin urgente o en una sala de emergencias.  Si tiene Engineering geologist, por favor llame inmediatamente al 911 o vaya a la sala de emergencias.  Nmeros de bper  - Dr. Nehemiah Massed: [phone]  - Dra. Moye: [phone]  - Dra. Nicole Kindred: [phone]  En caso de inclemencias del [place], por favor llame a Johnsie Kindred principal al [phone] para una actualizacin sobre el [place] de cualquier retraso o cierre.  Consejos para la medicacin en dermatologa: Por favor, guarde las cajas en las que vienen los medicamentos de uso tpico para ayudarle a seguir las instrucciones sobre dnde y cmo usarlos. Las farmacias generalmente imprimen las instrucciones del medicamento slo en las cajas y no directamente en los tubos del [place].   Si su medicamento es muy caro, por favor, pngase en contacto con Zigmund Daniel llamando al [phone] y presione la opcin 4 o envenos un mensaje a travs de Pharmacist, community.   No podemos decirle cul ser su copago por los medicamentos por adelantado ya que esto es diferente dependiendo de la cobertura de su seguro. Sin embargo, es posible que podamos encontrar un medicamento sustituto a Electrical engineer un formulario para que el seguro cubra el medicamento que se considera necesario.   Si se requiere una autorizacin previa para que su compaa de seguros [place] su medicamento, por favor permtanos de 1 a 2 das hbiles para completar este proceso.  Los precios de los medicamentos varan con frecuencia dependiendo del Environmental consultant de dnde se surte la receta y alguna farmacias pueden ofrecer precios ms baratos.  El sitio web www.goodrx.com tiene cupones para medicamentos de Airline pilot. Los precios aqu no tienen en cuenta lo que podra costar con la ayuda del seguro (puede ser ms  barato con su seguro), pero el sitio web puede darle el precio si no utiliz Research scientist (physical sciences).  - Puede imprimir el cupn correspondiente y llevarlo con su receta a la farmacia.  - Tambin puede pasar por nuestra oficina durante el horario de atencin regular y Charity fundraiser una tarjeta de cupones de GoodRx.  - Si necesita que su receta se enve electrnicamente a una farmacia diferente, informe a nuestra oficina a travs de MyChart de [place] o por telfono llamando al [phone] y presione la opcin 4.

## 2021-04-25 ENCOUNTER — Encounter: Payer: Self-pay | Admitting: Dermatology

## 2021-06-29 DIAGNOSIS — N760 Acute vaginitis: Secondary | ICD-10-CM | POA: Diagnosis not present

## 2021-08-24 ENCOUNTER — Encounter: Payer: Self-pay | Admitting: Dermatology

## 2021-08-24 ENCOUNTER — Other Ambulatory Visit: Payer: Self-pay | Admitting: Family Medicine

## 2021-08-24 DIAGNOSIS — G43809 Other migraine, not intractable, without status migrainosus: Secondary | ICD-10-CM

## 2021-08-24 DIAGNOSIS — L700 Acne vulgaris: Secondary | ICD-10-CM

## 2021-08-27 MED ORDER — TRETINOIN 0.025 % EX CREA: TOPICAL_CREAM | CUTANEOUS | 0 refills | Status: DC

## 2021-10-10 ENCOUNTER — Ambulatory Visit: Payer: Medicaid Other | Admitting: Dermatology

## 2021-10-11 IMAGING — MG DIGITAL DIAGNOSTIC BILAT W/ TOMO W/ CAD
6 of 10 series · 6 of 30 positions shown · non-contrast
Comparison: 11/08/2015.

CLINICAL DATA: Patient presents with left axillary swelling and
tenderness since having a [id number] vaccine. Also with occasional
left lateral breast pain.

EXAM:
DIGITAL DIAGNOSTIC BILATERAL MAMMOGRAM WITH CAD AND TOMO
ULTRASOUND LEFT BREAST/AXILLA

[R CC synth-2D]
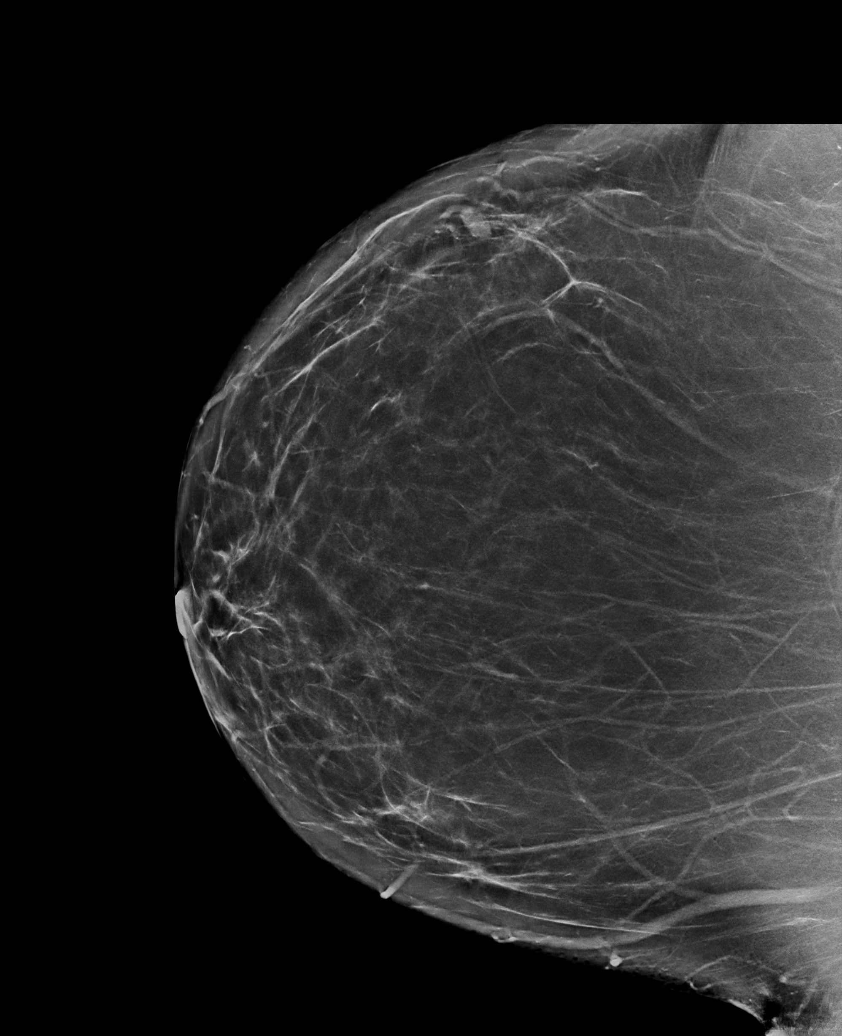

[L MLO synth-2D]
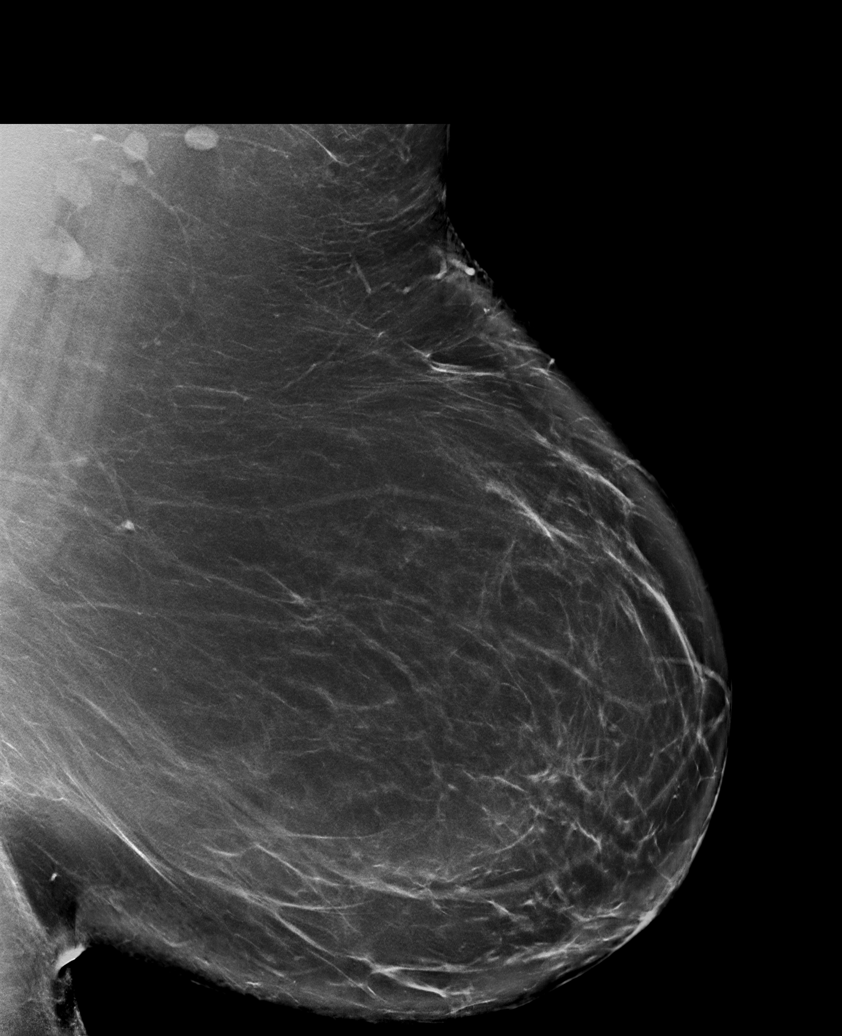

[R MLO synth-2D (1 of 2)]
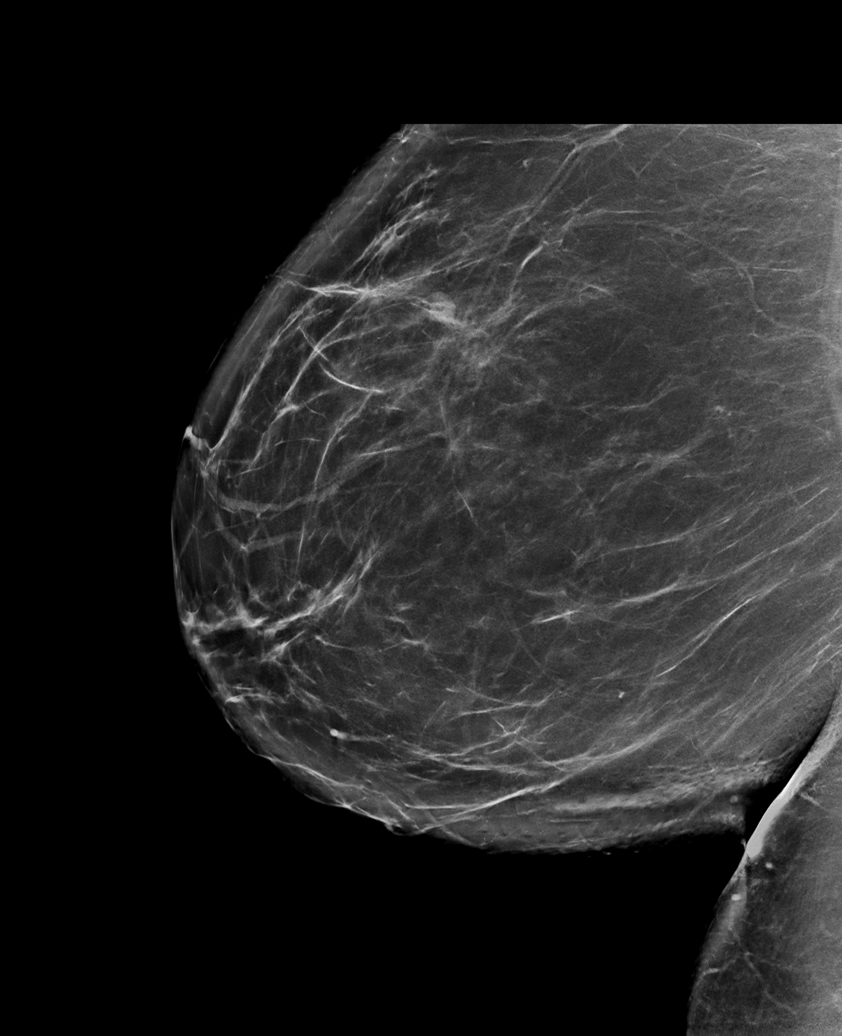

[R MLO synth-2D (2 of 2)]
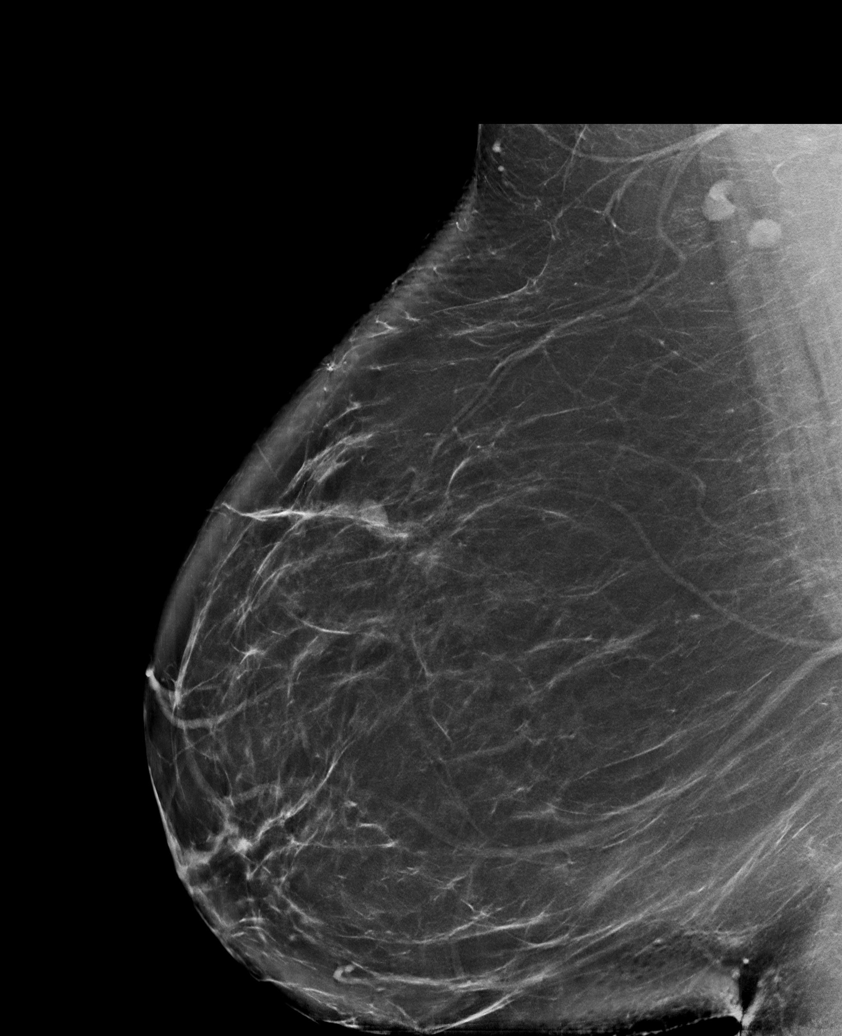

[L CC synth-2D]
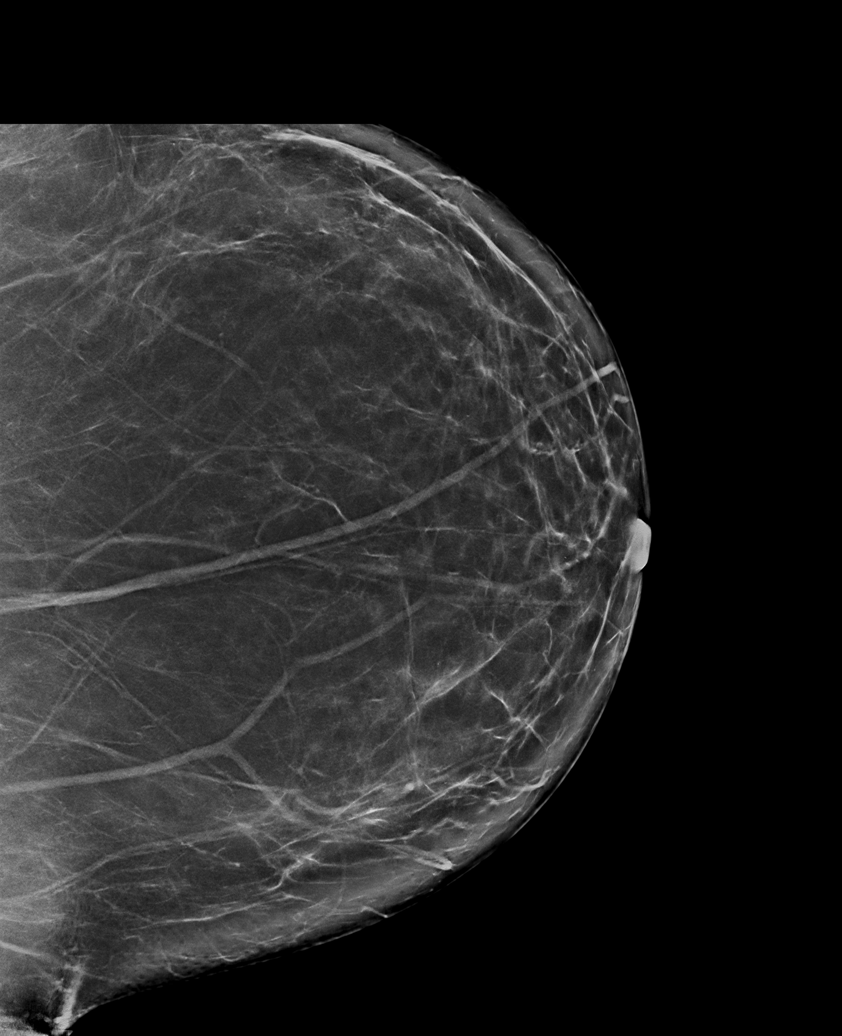

[R CC tomo · tomo slice 50/99.0]
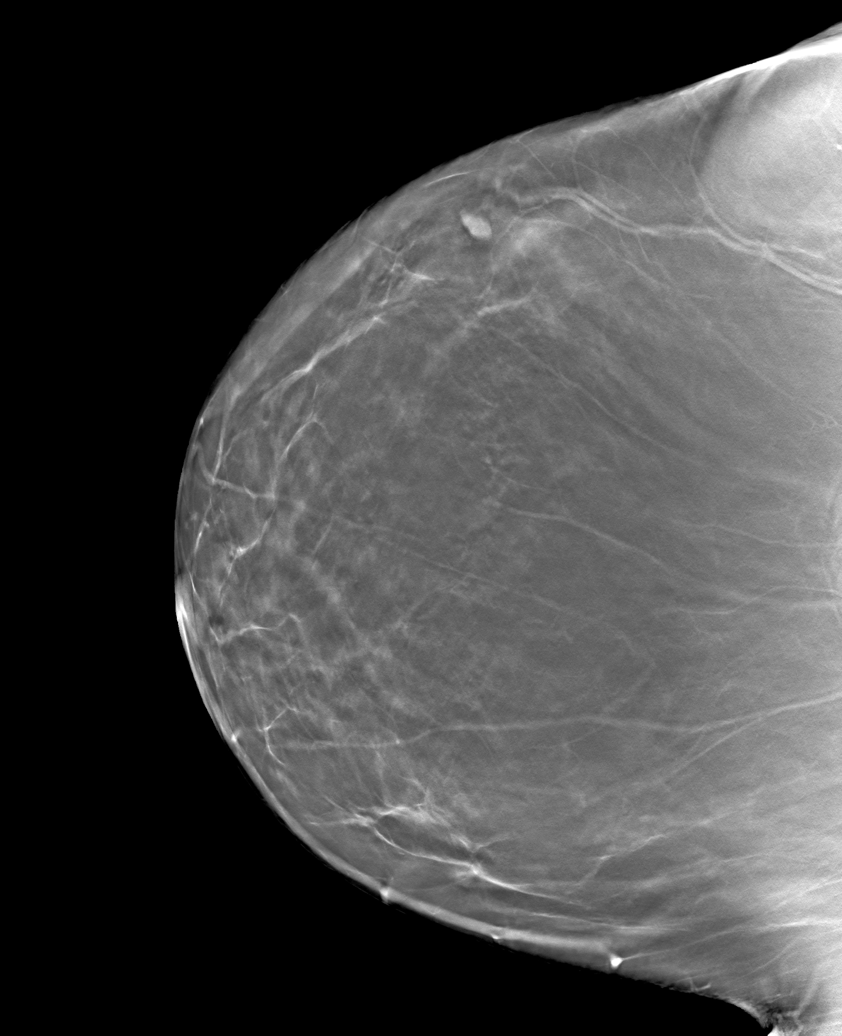

[6 of 30 positions shown; findings below may reference images not displayed]

ACR Breast Density Category b: There are scattered areas of
fibroglandular density.
FINDINGS: There are no masses, areas of architectural distortion, areas of
significant asymmetry or suspicious calcifications. No mammographic
change.

Mammographic images were processed with CAD.

On physical exam, there is soft prominence of the left axilla
consistent with prominent subcutaneous fat. There is no defined
mass.

Targeted ultrasound is performed, showing normal tissue throughout
the lateral left breast and left axilla. Normal left axillary lymph
nodes are noted. There are no masses or enlarged lymph nodes.
IMPRESSION: 1. Normal exam. No evidence of breast malignancy. No abnormal or
enlarged left axillary lymph nodes.

RECOMMENDATION:
Screening mammogram at age 38 unless there are persistent or
intervening clinical concerns. (Code:[id number])

I have discussed the findings and recommendations with the patient.
If applicable, a reminder letter will be sent to the patient
regarding the next appointment.

BI-RADS CATEGORY  1: Negative.

## 2021-11-13 ENCOUNTER — Ambulatory Visit: Payer: Medicaid Other | Admitting: Dermatology

## 2021-11-22 ENCOUNTER — Ambulatory Visit: Payer: Medicaid Other | Admitting: Dermatology

## 2021-12-11 ENCOUNTER — Encounter: Payer: Self-pay | Admitting: Dermatology

## 2021-12-11 ENCOUNTER — Ambulatory Visit: Payer: Medicaid Other | Admitting: Dermatology

## 2021-12-11 DIAGNOSIS — L729 Follicular cyst of the skin and subcutaneous tissue, unspecified: Secondary | ICD-10-CM | POA: Diagnosis not present

## 2021-12-11 DIAGNOSIS — L719 Rosacea, unspecified: Secondary | ICD-10-CM | POA: Diagnosis not present

## 2021-12-11 DIAGNOSIS — L700 Acne vulgaris: Secondary | ICD-10-CM | POA: Diagnosis not present

## 2021-12-11 MED ORDER — RHOFADE 1 % EX CREA: TOPICAL_CREAM | CUTANEOUS | 3 refills | Status: DC

## 2021-12-11 MED ORDER — SPIRONOLACTONE 100 MG PO TABS: ORAL_TABLET | ORAL | 3 refills | Status: DC

## 2021-12-11 NOTE — Progress Notes (Signed)
Follow-Up Visit   Subjective  Diana Bautista is a 36 y.o. female who presents for the following: Acne (Rosacea/Acne. Face. 8 month follow up. Using Tretinoin 0.025% cream. Stopped Metronidazole and used Winlevi for 2 months. States Nigel Bridgeman made her skin feel smooth but didn't necessarily help with the rosacea. Pharmacy had a hard time keeping Winlevi in [place] so she went back to using Tretinoin). In the past has used Azelaic acid, Metronidazole, Doxycycline '20mg'$ , Tretinoin and Winlevi.   The following portions of the chart were reviewed this encounter and updated as appropriate:  Tobacco  Allergies  Meds  Problems  Med Hx  Surg Hx  Fam Hx      Review of Systems: No other skin or systemic complaints except as noted in HPI or Assessment and Plan.   Objective  Well appearing patient in no apparent distress; mood and affect are within normal limits.  A focused examination was performed including face. Relevant physical exam findings are noted in the Assessment and Plan.  face Few inflammatory papules and cysts   face Mid face erythema with telangiectasias + scattered inflammatory papules.   left cheek x1, right neck x2 Subcutaneous papules   Assessment & Plan  Acne vulgaris face  Acne and Rosacea overlap Chronic condition with duration or expected duration over one year. Condition is bothersome to patient. Currently flared.    BP: 138/89  Start Spironolactone '100mg'$  1 tablet at bedtime.  Spironolactone can cause increased urination and cause blood pressure to decrease. Please watch for signs of lightheadedness and be cautious when changing position. It can sometimes cause breast tenderness or an irregular period in premenopausal women. It can also increase potassium. The increase in potassium usually is not a concern unless you are taking other medicines that also increase potassium, so please be sure your doctor knows all of the other medications you are taking. This  medication should not be taken by pregnant women.  This medicine should also not be taken together with sulfa drugs like Bactrim (trimethoprim/sulfamethexazole).  Marland Kitchen  spironolactone (ALDACTONE) 100 MG tablet - face Take 1 tablet by mouth qhs  Related Medications Clascoterone (WINLEVI) 1 % CREA Apply twice daily to face  tretinoin (RETIN-A) 0.025 % cream Apply a pea sized amount to the entire face QHS.  Rosacea face  Start Rhofade cream to face every morning.   Discussed the treatment option of BBL/laser.  Typically we recommend 1-3 treatment sessions about 5-8 weeks apart for best results.  The patient's condition may require "maintenance treatments" in the future.  The fee for BBL / laser treatments is $350 per treatment session for the whole face.  A fee can be quoted for other parts of the body. Insurance typically does not pay for BBL/laser treatments and therefore the fee is an out-of-pocket cost.     Oxymetazoline HCl (RHOFADE) 1 % CREA - face Apply to face every morning.  Related Medications METROCREAM 0.75 % cream Apply topically 2 (two) times daily.  Cyst of skin left cheek x1, right neck x2  Symptomatic  Right neck x2, left cheek x1  Intralesional injection - left cheek x1, right neck x2 Location: left cheek, right neck  Informed Consent: Discussed risks (infection, pain, bleeding, bruising, thinning of the skin, loss of skin pigment, lack of resolution, and recurrence of lesion) and benefits of the procedure, as well as the alternatives. Informed consent was obtained. Preparation: The area was prepared a standard fashion.  Anesthesia: none  Procedure Details: An  intralesional injection was performed with Kenalog 1.25 mg/cc. 0.15 cc in total were injected.  Total number of injections: 3  Plan: The patient was instructed on post-op care. Recommend OTC analgesia as needed for pain.  NDC: 2902-1115-52   Return for Rosacea Follow Up, Acne Follow Up 3  months.  I, Emelia Salisbury, CMA, am acting as scribe for Forest Gleason, MD.   Documentation: I have reviewed the above documentation for accuracy and completeness, and I agree with the above.  Forest Gleason, MD

## 2021-12-11 NOTE — Patient Instructions (Addendum)
Start Rhofade cream to face every morning.   Start Spironolactone '100mg'$  1 tablet at bedtime.  Spironolactone can cause increased urination and cause blood pressure to decrease. Please watch for signs of lightheadedness and be cautious when changing position. It can sometimes cause breast tenderness or an irregular period in premenopausal women. It can also increase potassium. The increase in potassium usually is not a concern unless you are taking other medicines that also increase potassium, so please be sure your doctor knows all of the other medications you are taking. This medication should not be taken by pregnant women.  This medicine should also not be taken together with sulfa drugs like Bactrim (trimethoprim/sulfamethexazole).  .    Recommend daily broad spectrum sunscreen SPF 30+ to sun-exposed areas, reapply every 2 hours as needed. Call for new or changing lesions.  Staying in the shade or wearing long sleeves, sun glasses (UVA+UVB protection) and wide brim hats (4-inch brim around the entire circumference of the hat) are also recommended for sun protection.    Due to recent changes in healthcare laws, you may see results of your pathology and/or laboratory studies on MyChart before the doctors have had a chance to review them. We understand that in some cases there may be results that are confusing or concerning to you. Please understand that not all results are received at the same time and often the doctors may need to interpret multiple results in order to provide you with the best plan of care or course of treatment. Therefore, we ask that you please give [place] 2 business days to thoroughly review all your results before contacting the office for clarification. Should we see a critical lab result, you will be contacted sooner.   If You Need Anything After Your Visit  If you have any questions or concerns for your doctor, please call our main line at [phone] and press option 4 to  reach your doctor's medical assistant. If no one answers, please leave a voicemail as directed and we will return your call as soon as possible. Messages left after 4 pm will be answered the following business day.   You may also send [place] a message via [place]. We typically respond to MyChart messages within 1-2 business days.  For prescription refills, please ask your pharmacy to contact our office. Our fax number is [phone].  If you have an urgent issue when the clinic is closed that cannot wait until the next business day, you can page your doctor at the number below.    Please note that while we do our best to be available for urgent issues outside of office hours, we are not available 24/7.   If you have an urgent issue and are unable to reach [place], you may choose to seek medical care at your doctor's office, retail clinic, urgent care center, or emergency room.  If you have a medical emergency, please immediately call 911 or go to the emergency department.  Pager Numbers  - Dr. Nehemiah Massed: [phone]  - Dr. Laurence Ferrari: [phone]  - Dr. Nicole Kindred: [phone]  In the event of inclement weather, please call our main line at [phone] for an update on the status of any delays or closures.  Dermatology Medication Tips: Please keep the boxes that topical medications come in in order to help keep track of the instructions about where and how to use these. Pharmacies typically print the medication instructions only on the boxes and not directly on the medication tubes.  If your medication is too expensive, please contact our office at [phone] option 4 or send [place] a message through [place].   We are unable to tell what your co-pay for medications will be in advance as this is different depending on your insurance coverage. However, we may be able to find a substitute medication at lower cost or fill out paperwork to get insurance to cover a needed medication.   If a prior  authorization is required to get your medication covered by your insurance company, please allow [place] 1-2 business days to complete this process.  Drug prices often vary depending on where the prescription is filled and some pharmacies may offer cheaper prices.  The website www.goodrx.com contains coupons for medications through different pharmacies. The prices here do not account for what the cost may be with help from insurance (it may be cheaper with your insurance), but the website can give you the price if you did not use any insurance.  - You can print the associated coupon and take it with your prescription to the pharmacy.  - You may also stop by our office during regular business hours and pick up a GoodRx coupon card.  - If you need your prescription sent electronically to a different pharmacy, notify our office through [facility] or by phone at [phone] option 4.     Si Usted Necesita Algo Despus de Su Visita  Tambin puede enviarnos un mensaje a travs de Pharmacist, community. Por lo general respondemos a los mensajes de MyChart en el transcurso de 1 a 2 das hbiles.  Para renovar recetas, por favor pida a su farmacia que se ponga en contacto con nuestra oficina. Harland Dingwall de fax es [place] [phone].  Si tiene un asunto urgente cuando la clnica est cerrada y que no puede esperar hasta el siguiente da hbil, puede llamar/localizar a su doctor(a) al nmero que aparece a continuacin.   Por favor, tenga en cuenta que aunque hacemos todo lo posible para estar disponibles para asuntos urgentes fuera del horario de [place], no estamos disponibles las 24 horas del da, los 7 das de la [place].   Si tiene un problema urgente y no puede comunicarse con nosotros, puede optar por buscar atencin mdica  en el consultorio de su doctor(a), en una clnica privada, en un centro de atencin urgente o en una sala de emergencias.  Si tiene Engineering geologist, por favor llame  inmediatamente al 911 o vaya a la sala de emergencias.  Nmeros de bper  - Dr. Nehemiah Massed: [phone]  - Dra. Moye: [phone]  - Dra. Nicole Kindred: [phone]  En caso de inclemencias del [place], por favor llame a Johnsie Kindred principal al [phone] para una actualizacin sobre el [place] de cualquier retraso o cierre.  Consejos para la medicacin en dermatologa: Por favor, guarde las cajas en las que vienen los medicamentos de uso tpico para ayudarle a seguir las instrucciones sobre dnde y cmo usarlos. Las farmacias generalmente imprimen las instrucciones del medicamento slo en las cajas y no directamente en los tubos del [place].   Si su medicamento es muy caro, por favor, pngase en contacto con Zigmund Daniel llamando al [phone] y presione la opcin 4 o envenos un mensaje a travs de Pharmacist, community.   No podemos decirle cul ser su copago por los medicamentos por adelantado ya que esto es diferente dependiendo de la cobertura de su seguro. Sin embargo, es posible que podamos encontrar un medicamento sustituto a Geneticist, molecular  formulario para que el seguro cubra el medicamento que se considera necesario.   Si se requiere una autorizacin previa para que su compaa de seguros [place] su medicamento, por favor permtanos de 1 a 2 das hbiles para completar este proceso.  Los precios de los medicamentos varan con frecuencia dependiendo del Environmental consultant de dnde se surte la receta y alguna farmacias pueden ofrecer precios ms baratos.  El sitio web www.goodrx.com tiene cupones para medicamentos de Airline pilot. Los precios aqu no tienen en cuenta lo que podra costar con la ayuda del seguro (puede ser ms barato con su seguro), pero el sitio web puede darle el precio si no utiliz Research scientist (physical sciences).  - Puede imprimir el cupn correspondiente y llevarlo con su receta a la farmacia.  - Tambin puede pasar por nuestra oficina durante el horario de atencin regular y Charity fundraiser  una tarjeta de cupones de GoodRx.  - Si necesita que su receta se enve electrnicamente a una farmacia diferente, informe a nuestra oficina a travs de MyChart de [place] o por telfono llamando al [phone] y presione la opcin 4.

## 2021-12-12 ENCOUNTER — Encounter: Payer: Self-pay | Admitting: Dermatology

## 2021-12-19 ENCOUNTER — Encounter: Payer: Self-pay | Admitting: Dermatology

## 2021-12-25 ENCOUNTER — Encounter: Payer: Self-pay | Admitting: Physician Assistant

## 2021-12-25 ENCOUNTER — Ambulatory Visit: Payer: Medicaid Other | Admitting: Physician Assistant

## 2021-12-25 ENCOUNTER — Ambulatory Visit: Payer: Medicaid Other | Attending: Physician Assistant

## 2021-12-25 VITALS — BP 118/90 | HR 97 | Ht 64.0 in | Wt 289.5 lb

## 2021-12-25 DIAGNOSIS — R002 Palpitations: Secondary | ICD-10-CM | POA: Insufficient documentation

## 2021-12-25 DIAGNOSIS — Z1159 Encounter for screening for other viral diseases: Secondary | ICD-10-CM | POA: Diagnosis not present

## 2021-12-25 DIAGNOSIS — R030 Elevated blood-pressure reading, without diagnosis of hypertension: Secondary | ICD-10-CM | POA: Diagnosis not present

## 2021-12-25 DIAGNOSIS — F419 Anxiety disorder, unspecified: Secondary | ICD-10-CM | POA: Insufficient documentation

## 2021-12-25 DIAGNOSIS — Z23 Encounter for immunization: Secondary | ICD-10-CM

## 2021-12-25 NOTE — Patient Instructions (Addendum)
Radical Acceptance Book by Jama Flavors  Virtual therapy options: [email or web address]

## 2021-12-25 NOTE — Progress Notes (Addendum)
I,Sha'taria Tyson,acting as a Education administrator for [employer], PA-C.,have documented all relevant documentation on the behalf of Mikey Kirschner, PA-C,as directed by  Mikey Kirschner, PA-C while in the presence of Mikey Kirschner, PA-C.   Established patient visit   Patient: Diana Bautista   DOB: Oct 15, 1985   36 y.o. Female  MRN: [id number] Visit Date: 12/25/2021  Today's healthcare provider: Mikey Kirschner, PA-C   Cc. Palpitations   Subjective    HPI  Cerria is a 36 y/o female who presents today with heart palpitations on and off for 2-3 years. She reports a significant amount of medical anxiety which has lead to putting off this appointment. Reports the palpitations occur infrequently -- last one was two days ago, but before that was several months ago. When she feels the palpitations it feels like her heart is skipping beats, and she can feel it in her pulse. She feels the sensation in her throat. This brings her anxiety ,but she also reports a general higher level of anxiety and fear of the worse happening since she gave birth to her children.  The episode on 'Sunday, she had little sleep and increased stress as she was hosting a sleepover.  Denies chest pain, dizziness, SOB, pain or palpitations with exertion.      10'$ /17/2023   11:09 AM  GAD 7 : Generalized Anxiety Score  Nervous, Anxious, on Edge 3  Control/stop worrying 3  Worry too much - different things 2  Trouble relaxing 1  Restless 0  Easily annoyed or irritable 0  Afraid - awful might happen 3  Total GAD 7 Score 12  Anxiety Difficulty Not difficult at all   Medications: Outpatient Medications Prior to Visit  Medication Sig   Oxymetazoline HCl (RHOFADE) 1 % CREA Apply to face every morning.   spironolactone (ALDACTONE) 100 MG tablet Take 1 tablet by mouth qhs   tretinoin (RETIN-A) 0.025 % cream Apply a pea sized amount to the entire face QHS.   famotidine (PEPCID) 20 MG tablet Take 1 tablet (20 mg total) by mouth  daily.   SUMAtriptan (IMITREX) 50 MG tablet Take 1 tablet (50 mg total) by mouth once for 1 dose. May repeat in 2 hours if headache persists or recurs.   [DISCONTINUED] Clascoterone (WINLEVI) 1 % CREA Apply twice daily to face (Patient not taking: Reported on 12/25/2021)   [DISCONTINUED] clobetasol (TEMOVATE) 0.05 % external solution Apply 1 application topically as directed. Qd to bid aa scalp until clear, avoid face, groin, axilla (Patient not taking: Reported on 06/01/2020)   [DISCONTINUED] fluconazole (DIFLUCAN) 150 MG tablet Take one pill day one and repeat three days later if symptomatic. (Patient not taking: Reported on 05/24/2020)   [DISCONTINUED] hydrocortisone 2.5 % cream Apply topically 2 (two) times daily as needed (Rash). twice a day prn up to 1 week (Patient not taking: Reported on 12/25/2021)   [DISCONTINUED] levonorgestrel (LILETTA) 19.5 MCG/DAY IUD IUD by Intrauterine route. (Patient not taking: Reported on 06/01/2020)   [DISCONTINUED] METROCREAM 0.75 % cream Apply topically 2 (two) times daily. (Patient not taking: Reported on 12/25/2021)   [DISCONTINUED] Norethindrone Acetate-Ethinyl Estrad-FE (BLISOVI 24 FE) 1-20 MG-MCG(24) tablet Take 1 tablet by mouth daily. (Patient not taking: Reported on 06/01/2020)   [DISCONTINUED] nystatin-triamcinolone (MYCOLOG II) cream Apply 1 application topically 2 (two) times daily. (Patient not taking: Reported on 06/01/2020)   [DISCONTINUED] triamcinolone cream (KENALOG) 0.1 % Apply 1 application topically 2 (two) times daily as needed. To affected areas of rash for  up to 2 weeks. Avoid applying to face, groin, and axilla. Use as directed. Long-term use can cause thinning of the skin. (Patient not taking: Reported on 12/25/2021)   No facility-administered medications prior to visit.    Review of Systems  Constitutional:  Negative for fatigue and fever.  Respiratory:  Negative for cough and shortness of breath.   Cardiovascular:  Positive for  palpitations. Negative for chest pain and leg swelling.  Gastrointestinal:  Negative for abdominal pain.  Neurological:  Negative for dizziness and headaches.  Psychiatric/Behavioral:  The patient is nervous/anxious.       Objective    Blood pressure (!) 118/90, pulse 97, height '5\' 4"'$  (1.626 m), weight 289 lb 8 oz (131.3 kg), last menstrual period 12/17/2021, SpO2 100 %.   Physical Exam Constitutional:      General: She is awake.     Appearance: She is well-developed.  HENT:     Head: Normocephalic.  Eyes:     Conjunctiva/sclera: Conjunctivae normal.  Cardiovascular:     Rate and Rhythm: Normal rate and regular rhythm.     Heart sounds: Normal heart sounds.  Pulmonary:     Effort: Pulmonary effort is normal.     Breath sounds: Normal breath sounds.  Skin:    General: Skin is warm.  Neurological:     Mental Status: She is alert and oriented to person, place, and time.  Psychiatric:        Attention and Perception: Attention normal.        Mood and Affect: Mood normal.        Speech: Speech normal.        Behavior: Behavior is cooperative.      No results found for any visits on 12/25/21.  Assessment & Plan     Problem List Items Addressed This Visit       Other   Palpitations - Primary    Will order labs -- cbc, cmp, tsh/t4, iron panel , pt has been anemic before Ordered zio monitor  Will review results w/ pt Reassured could all be 2/2 to anxiety      Relevant Orders   TSH + free T4   CBC w/Diff/Platelet   Comprehensive Metabolic Panel (CMET)   Iron, TIBC and Ferritin Panel   LONG TERM MONITOR (3-14 DAYS)   Anxiety    Discussed anxiety symptoms, discussed therapy. Pt not open to a referral at this time.       Elevated blood pressure reading    Likely 2/2 to medical anxiety / white coat as pt had normal BP in derm office Advised she check at home 2-3 times a week and return to office to review in 6 weeks      Other Visit Diagnoses     Encounter for  hepatitis C screening test for low risk patient       Relevant Orders   Hepatitis C antibody   Need for immunization against influenza       Relevant Orders   Flu Vaccine QUAD 67moIM (Fluarix, Fluzone & Alfiuria Quad PF) (Completed)        Return in about 6 weeks (around 02/05/2022) for hypertension, anxiety.      I, LMikey Kirschner PA-C have reviewed all documentation for this visit. The documentation on  12/25/2021  for the exam, diagnosis, procedures, and orders are all accurate and complete.  LMikey Kirschner PA-C BL[facility] [place] [address]Office: [phone]: [phone]  [facility]  

## 2021-12-25 NOTE — Assessment & Plan Note (Addendum)
Will order labs -- cbc, cmp, tsh/t4, iron panel , pt has been anemic before Ordered zio monitor  Will review results w/ pt Reassured could all be 2/2 to anxiety

## 2021-12-25 NOTE — Assessment & Plan Note (Signed)
Likely 2/2 to medical anxiety / white coat as pt had normal BP in derm office Advised she check at home 2-3 times a week and return to office to review in 6 weeks

## 2021-12-25 NOTE — Assessment & Plan Note (Signed)
Discussed anxiety symptoms, discussed therapy. Pt not open to a referral at this time.

## 2021-12-26 DIAGNOSIS — R002 Palpitations: Secondary | ICD-10-CM | POA: Diagnosis not present

## 2021-12-26 DIAGNOSIS — Z1159 Encounter for screening for other viral diseases: Secondary | ICD-10-CM | POA: Diagnosis not present

## 2021-12-27 ENCOUNTER — Encounter: Payer: Self-pay | Admitting: Physician Assistant

## 2021-12-27 DIAGNOSIS — R002 Palpitations: Secondary | ICD-10-CM

## 2021-12-27 LAB — COMPREHENSIVE METABOLIC PANEL
ALT: 17 IU/L (ref 0–32)
AST: 14 IU/L (ref 0–40)
Albumin/Globulin Ratio: 1.5 (ref 1.2–2.2)
Albumin: 4.1 g/dL (ref 3.9–4.9)
Alkaline Phosphatase: 89 IU/L (ref 44–121)
BUN/Creatinine Ratio: 14 (ref 9–23)
BUN: 12 mg/dL (ref 6–20)
Bilirubin Total: 0.6 mg/dL (ref 0.0–1.2)
CO2: 23 mmol/L (ref 20–29)
Calcium: 9.4 mg/dL (ref 8.7–10.2)
Chloride: 102 mmol/L (ref 96–106)
Creatinine, Ser: 0.85 mg/dL (ref 0.57–1.00)
Globulin, Total: 2.8 g/dL (ref 1.5–4.5)
Glucose: 99 mg/dL (ref 70–99)
Potassium: 4.4 mmol/L (ref 3.5–5.2)
Sodium: 140 mmol/L (ref 134–144)
Total Protein: 6.9 g/dL (ref 6.0–8.5)
eGFR: 90 mL/min/{1.73_m2} (ref >59)

## 2021-12-27 LAB — CBC WITH DIFFERENTIAL/PLATELET
Basophils Absolute: 0 10*3/uL (ref 0.0–0.2)
Basos: 0 %
EOS (ABSOLUTE): 0.1 10*3/uL (ref 0.0–0.4)
Eos: 2 %
Hematocrit: 39.9 % (ref 34.0–46.6)
Hemoglobin: 13.3 g/dL (ref 11.1–15.9)
Immature Grans (Abs): 0 10*3/uL (ref 0.0–0.1)
Immature Granulocytes: 0 %
Lymphocytes Absolute: 1.9 10*3/uL (ref 0.7–3.1)
Lymphs: 28 %
MCH: 28.2 pg (ref 26.6–33.0)
MCHC: 33.3 g/dL (ref 31.5–35.7)
MCV: 85 fL (ref 79–97)
Monocytes Absolute: 0.5 10*3/uL (ref 0.1–0.9)
Monocytes: 7 %
Neutrophils Absolute: 4.2 10*3/uL (ref 1.4–7.0)
Neutrophils: 63 %
Platelets: 435 10*3/uL (ref 150–450)
RBC: 4.71 x10E6/uL (ref 3.77–5.28)
RDW: 12.8 % (ref 11.7–15.4)
WBC: 6.7 10*3/uL (ref 3.4–10.8)

## 2021-12-27 LAB — HEPATITIS C ANTIBODY: Hep C Virus Ab: NONREACTIVE

## 2021-12-27 LAB — IRON,TIBC AND FERRITIN PANEL
Ferritin: 52 ng/mL (ref 15–150)
Iron Saturation: 37 % (ref 15–55)
Iron: 128 ug/dL (ref 27–159)
Total Iron Binding Capacity: 346 ug/dL (ref 250–450)
UIBC: 218 ug/dL (ref 131–425)

## 2021-12-27 LAB — TSH+FREE T4
Free T4: 1.4 ng/dL (ref 0.82–1.77)
TSH: 2.21 u[IU]/mL (ref 0.450–4.500)

## 2021-12-28 ENCOUNTER — Other Ambulatory Visit: Payer: Self-pay | Admitting: Physician Assistant

## 2021-12-28 DIAGNOSIS — G43809 Other migraine, not intractable, without status migrainosus: Secondary | ICD-10-CM

## 2021-12-28 MED ORDER — SUMATRIPTAN SUCCINATE 50 MG PO TABS: 50.0000 mg | ORAL_TABLET | Freq: Once | ORAL | 0 refills | Status: DC

## 2022-01-10 ENCOUNTER — Other Ambulatory Visit: Payer: Self-pay | Admitting: Physician Assistant

## 2022-01-10 ENCOUNTER — Telehealth: Payer: Self-pay

## 2022-01-10 DIAGNOSIS — R002 Palpitations: Secondary | ICD-10-CM

## 2022-01-10 MED ORDER — PROPRANOLOL HCL 10 MG PO TABS: 10.0000 mg | ORAL_TABLET | Freq: Two times a day (BID) | ORAL | 1 refills | Status: DC

## 2022-01-10 NOTE — Telephone Encounter (Signed)
Pt was called back

## 2022-01-10 NOTE — Telephone Encounter (Signed)
Copied from [employer] [phone]. Topic: General - Call Back - No Documentation >> Jan 10, 2022 11:01 AM Diana Bautista wrote: Patient states she is returning Lindsay's call. Patient said she may be unavailable between 02-1229 today.

## 2022-02-05 ENCOUNTER — Encounter: Payer: Self-pay | Admitting: Physician Assistant

## 2022-02-06 ENCOUNTER — Encounter: Payer: Self-pay | Admitting: Physician Assistant

## 2022-02-06 ENCOUNTER — Telehealth (INDEPENDENT_AMBULATORY_CARE_PROVIDER_SITE_OTHER): Payer: Medicaid Other | Admitting: Physician Assistant

## 2022-02-06 VITALS — BP 101/87 | HR 65

## 2022-02-06 DIAGNOSIS — R030 Elevated blood-pressure reading, without diagnosis of hypertension: Secondary | ICD-10-CM

## 2022-02-06 DIAGNOSIS — Z3009 Encounter for other general counseling and advice on contraception: Secondary | ICD-10-CM

## 2022-02-06 DIAGNOSIS — R002 Palpitations: Secondary | ICD-10-CM | POA: Diagnosis not present

## 2022-02-06 DIAGNOSIS — F419 Anxiety disorder, unspecified: Secondary | ICD-10-CM

## 2022-02-06 MED ORDER — BLISOVI 24 FE 1-20 MG-MCG(24) PO TABS: 1.0000 | ORAL_TABLET | Freq: Every day | ORAL | 3 refills | Status: DC

## 2022-02-06 NOTE — Progress Notes (Signed)
I,Sha'taria Tyson,acting as a Education administrator for [employer], PA-C.,have documented all relevant documentation on the behalf of Mikey Kirschner, PA-C,as directed by  Mikey Kirschner, PA-C while in the presence of Mikey Kirschner, PA-C.   MyChart Video Visit    Virtual Visit via Video Note   This format is felt to be most appropriate for this patient at this time. Physical exam was limited by quality of the video and audio technology used for the visit.   Patient location: Home Provider location: [facility]  I discussed the limitations of evaluation and management by telemedicine and the availability of in person appointments. The patient expressed understanding and agreed to proceed.  Patient: Diana Bautista   DOB: [birth year]   36 y.o. Female  MRN: [id number] Visit Date: 02/06/2022  Today's healthcare provider: Mikey Kirschner, PA-C   Cc. High bp f/u, anxiety, palpitations  Subjective    HPI  Hypertension, follow-up  BP Readings from Last 3 Encounters:  02/06/22 101/87  12/25/21 (!) 118/90  06/16/19 (!) 148/93   Wt Readings from Last 3 Encounters:  12/25/21 289 lb 8 oz (131.3 kg)  06/16/19 291 lb (132 kg)  03/01/19 289 lb (131.1 kg)     She was last seen for hypertension 6 weeks ago.  BP at that visit was 118/90. Management since that visit includes check 2-3 times a week.  She is following a Regular diet. She is exercising. She does not smoke.  Outside blood pressures are 110/80's Symptoms:none  Pertinent labs Lab Results  Component Value Date   CHOL 218 (H) 11/12/2018   HDL 59 11/12/2018   LDLCALC 146 (H) 11/12/2018   TRIG 74 11/12/2018   CHOLHDL 3.7 11/12/2018   Lab Results  Component Value Date   NA 140 12/26/2021   K 4.4 12/26/2021   CREATININE 0.85 12/26/2021   EGFR 90 12/26/2021   GLUCOSE 99 12/26/2021   TSH 2.210 12/26/2021     The ASCVD Risk score (Arnett DK, et al., 2019) failed to calculate for the following reasons:   The 2019  ASCVD risk score is only valid for ages 49 to 41  ---------------------------------------------------------------------------------------------------  Anxiety, Follow-up  She was last seen for anxiety 6 weeks ago. Changes made at last visit include none.   She feels her anxiety is mild and Improved since last visit. She reports palpitations have resolved, she is taking propranolol 10 mg BID.   Symptoms: none  GAD-7 Results    12/25/2021   11:09 AM  GAD-7 Generalized Anxiety Disorder Screening Tool  1. Feeling Nervous, Anxious, or on Edge 3  2. Not Being Able to Stop or Control Worrying 3  3. Worrying Too Much About Different Things 2  4. Trouble Relaxing 1  5. Being So Restless it's Hard To Sit Still 0  6. Becoming Easily Annoyed or Irritable 0  7. Feeling Afraid As If Something Awful Might Happen 3  Total GAD-7 Score 12  Difficulty At Work, Home, or Getting  Along With Others? Not difficult at all    PHQ-9 Scores    12/25/2021   11:11 AM 11/12/2018   10:50 AM  PHQ9 SCORE ONLY  PHQ-9 Total Score 3 0    ---------------------------------------------------------------------------------------------------   She has questions about restarting an OCP for pregnancy prevention.  Medications: Outpatient Medications Prior to Visit  Medication Sig   Oxymetazoline HCl (RHOFADE) 1 % CREA Apply to face every morning.   propranolol (INDERAL) 10 MG tablet TAKE 1 TABLET(10 MG) BY  MOUTH TWICE DAILY   spironolactone (ALDACTONE) 100 MG tablet Take 1 tablet by mouth qhs   tretinoin (RETIN-A) 0.025 % cream Apply a pea sized amount to the entire face QHS.   famotidine (PEPCID) 20 MG tablet Take 1 tablet (20 mg total) by mouth daily.   SUMAtriptan (IMITREX) 50 MG tablet Take 1 tablet (50 mg total) by mouth once for 1 dose. May repeat in 2 hours if headache persists or recurs.   No facility-administered medications prior to visit.    Review of Systems  Constitutional:  Negative for  fatigue and fever.  Respiratory:  Negative for cough and shortness of breath.   Cardiovascular:  Negative for chest pain and leg swelling.  Gastrointestinal:  Negative for abdominal pain.  Neurological:  Negative for dizziness and headaches.  Psychiatric/Behavioral:  The patient is nervous/anxious.       Objective    Blood pressure 101/87, pulse 65.    Physical Exam Constitutional:      Appearance: She is not ill-appearing.  Neurological:     Mental Status: She is alert and oriented to person, place, and time.  Psychiatric:        Mood and Affect: Mood normal.        Behavior: Behavior normal.        Assessment & Plan     Problem List Items Addressed This Visit       Cardiovascular and Mediastinum   White coat syndrome without hypertension    BP at home in appropriate range. Elevated bp in office 2/2 anxiety        Other   Palpitations - Primary    Resolved w/ propranolol 10 mg bid.  Will continue to monitor      Anxiety    Stable, will  continue to monitor       Encounter for counseling regarding contraception    Restarted combination ocp She is currently on her period, advised Sunday start      Relevant Medications   Norethindrone Acetate-Ethinyl Estrad-FE (BLISOVI 24 FE) 1-20 MG-MCG(24) tablet    Return in about 6 months (around 08/07/2022) for anxiety.     I discussed the assessment and treatment plan with the patient. The patient was provided an opportunity to ask questions and all were answered. The patient agreed with the plan and demonstrated an understanding of the instructions.   The patient was advised to call back or seek an in-person evaluation if the symptoms worsen or if the condition fails to improve as anticipated.  I provided 15 minutes of non-face-to-face time during this encounter.  I, Mikey Kirschner, PA-C have reviewed all documentation for this visit. The documentation on  02/06/2022 for the exam, diagnosis, procedures, and orders  are all accurate and complete.  Mikey Kirschner, PA-C [facility] [address] [place], [place], [address] Office: [phone] Fax: [place]

## 2022-02-06 NOTE — Assessment & Plan Note (Signed)
Restarted combination ocp She is currently on her period, advised Sunday start

## 2022-02-06 NOTE — Assessment & Plan Note (Signed)
Resolved w/ propranolol 10 mg bid.  Will continue to monitor

## 2022-02-06 NOTE — Assessment & Plan Note (Signed)
BP at home in appropriate range. Elevated bp in office 2/2 anxiety

## 2022-02-06 NOTE — Assessment & Plan Note (Signed)
Stable, will continue to monitor.

## 2022-03-21 ENCOUNTER — Encounter: Payer: Self-pay | Admitting: Dermatology

## 2022-03-21 ENCOUNTER — Ambulatory Visit (INDEPENDENT_AMBULATORY_CARE_PROVIDER_SITE_OTHER): Payer: Medicaid Other | Admitting: Dermatology

## 2022-03-21 VITALS — BP 135/84 | HR 85

## 2022-03-21 DIAGNOSIS — L988 Other specified disorders of the skin and subcutaneous tissue: Secondary | ICD-10-CM | POA: Diagnosis not present

## 2022-03-21 DIAGNOSIS — L719 Rosacea, unspecified: Secondary | ICD-10-CM

## 2022-03-21 DIAGNOSIS — L700 Acne vulgaris: Secondary | ICD-10-CM

## 2022-03-21 MED ORDER — SPIRONOLACTONE 100 MG PO TABS: ORAL_TABLET | ORAL | 3 refills | Status: DC

## 2022-03-21 NOTE — Progress Notes (Signed)
   Follow-Up Visit   Subjective  Diana Bautista is a 37 y.o. female who presents for the following: Acne and Rosacea (Getting a little dry with tretinoin. Started birth control pill 1 month ago. Having slightly lighter and shorter periods. Doing ok overall. Using rhofade as needed for redness. ).  The following portions of the chart were reviewed this encounter and updated as appropriate:  Tobacco  Allergies  Meds  Problems  Med Hx  Surg Hx  Fam Hx      Review of Systems: No other skin or systemic complaints except as noted in HPI or Assessment and Plan.   Objective  Well appearing patient in no apparent distress; mood and affect are within normal limits.  A focused examination was performed including face, chest. Relevant physical exam findings are noted in the Assessment and Plan.  face Rare open comedones, scattered inflammatory papules  face Mid face erythema with telangiectasias  face Rhytides and volume loss.    Assessment & Plan  Acne vulgaris face  Chronic and persistent condition with duration or expected duration over one year. Condition is symptomatic / bothersome to patient. Not to goal.  Continue Spironolactone 100 mg once daily  Continue Tretinoin 0.025% cream at bedtime.  Will hold on switching to [facility] at this time.  Start Amzeeq thin layer to affected areas at bedtime.  Continued use of OCP may help with normalizing acne.  Related Medications tretinoin (RETIN-A) 0.025 % cream Apply a pea sized amount to the entire face QHS.  spironolactone (ALDACTONE) 100 MG tablet Take 1 tablet by mouth qhs  Rosacea face  Chronic condition with duration or expected duration over one year. Currently well-controlled.  Rosacea is a chronic progressive skin condition usually affecting the face of adults, causing redness and/or acne bumps. It is treatable but not curable. It sometimes affects the eyes (ocular rosacea) as well. It may respond to topical  and/or systemic medication and can flare with stress, sun exposure, alcohol, exercise, topical steroids (including hydrocortisone/cortisone 10) and some foods.  Daily application of broad spectrum spf 30+ sunscreen to face is recommended to reduce flares.  Continue Rhofade as needed.   Related Medications Oxymetazoline HCl (RHOFADE) 1 % CREA Apply to face every morning.  Elastosis of skin face  Suggest Botox plan 34 units as noted below:  Brow lift: 8 units Frown complex: 21 Forehead: 5 units  $442.00    Return in about 4 months (around 07/20/2022) for Acne Follow Up.  I, Emelia Salisbury, CMA, am acting as scribe for Forest Gleason, MD.   Documentation: I have reviewed the above documentation for accuracy and completeness, and I agree with the above.  Forest Gleason, MD

## 2022-03-21 NOTE — Patient Instructions (Addendum)
Continue Spironolactone 100 mg once daily  Continue Tretinoin 0.025% cream at bedtime.   Continue Rhofade as needed.   Botox plan 34 units as noted below:  Brow lift: 8 units Frown complex: 21 Forehead: 5 units  $442.00   Topical retinoid medications like tretinoin/Retin-A, adapalene/Differin, tazarotene/Fabior, and Epiduo/Epiduo Forte can cause dryness and irritation when first started. Only apply a pea-sized amount to the entire affected area. Avoid applying it around the eyes, edges of mouth and creases at the nose. If you experience irritation, use a good moisturizer first and/or apply the medicine less often. If you are doing well with the medicine, you can increase how often you use it until you are applying every night. Be careful with sun protection while using this medication as it can make you sensitive to the sun. This medicine should not be used by pregnant women.    Due to recent changes in healthcare laws, you may see results of your pathology and/or laboratory studies on MyChart before the doctors have had a chance to review them. We understand that in some cases there may be results that are confusing or concerning to you. Please understand that not all results are received at the same time and often the doctors may need to interpret multiple results in order to provide you with the best plan of care or course of treatment. Therefore, we ask that you please give [place] 2 business days to thoroughly review all your results before contacting the office for clarification. Should we see a critical lab result, you will be contacted sooner.   If You Need Anything After Your Visit  If you have any questions or concerns for your doctor, please call our main line at [phone] and press option 4 to reach your doctor's medical assistant. If no one answers, please leave a voicemail as directed and we will return your call as soon as possible. Messages left after 4 pm will be answered the  following business day.   You may also send [place] a message via [employer]. We typically respond to MyChart messages within 1-2 business days.  For prescription refills, please ask your pharmacy to contact our office. Our fax number is [phone].  If you have an urgent issue when the clinic is closed that cannot wait until the next business day, you can page your doctor at the number below.    Please note that while we do our best to be available for urgent issues outside of office hours, we are not available 24/7.   If you have an urgent issue and are unable to reach [place], you may choose to seek medical care at your doctor's office, retail clinic, urgent care center, or emergency room.  If you have a medical emergency, please immediately call 911 or go to the emergency department.  Pager Numbers  - Dr. Nehemiah Massed: [phone]  - Dr. Laurence Ferrari: [phone]  - Dr. Nicole Kindred: [phone]  In the event of inclement weather, please call our main line at [phone] for an update on the status of any delays or closures.  Dermatology Medication Tips: Please keep the boxes that topical medications come in in order to help keep track of the instructions about where and how to use these. Pharmacies typically print the medication instructions only on the boxes and not directly on the medication tubes.   If your medication is too expensive, please contact our office at [phone] option 4 or send [place] a message through [place].   We are unable  We are unable to tell what your co-pay for medications will be in advance as this is different depending on your insurance coverage. However, we may be able to find a substitute medication at lower cost or fill out paperwork to get insurance to cover a needed medication.   If a prior authorization is required to get your medication covered by your insurance company, please allow us 1-2 business days to complete this process.  Drug prices often vary depending on  where the prescription is filled and some pharmacies may offer cheaper prices.  The website www.goodrx.com contains coupons for medications through different pharmacies. The prices here do not account for what the cost may be with help from insurance (it may be cheaper with your insurance), but the website can give you the price if you did not use any insurance.  - You can print the associated coupon and take it with your prescription to the pharmacy.  - You may also stop by our office during regular business hours and pick up a GoodRx coupon card.  - If you need your prescription sent electronically to a different pharmacy, notify our office through [place] MyChart or by phone at [phone] option 4.     Si Usted Necesita Algo Despus de Su Visita  Tambin puede enviarnos un mensaje a travs de MyChart. Por lo general respondemos a los mensajes de MyChart en el transcurso de 1 a 2 das hbiles.  Para renovar recetas, por favor pida a su farmacia que se ponga en contacto con nuestra oficina. Nuestro nmero de fax es el [phone].  Si tiene un asunto urgente cuando la clnica est cerrada y que no puede esperar hasta el siguiente da hbil, puede llamar/localizar a su doctor(a) al nmero que aparece a continuacin.   Por favor, tenga en cuenta que aunque hacemos todo lo posible para estar disponibles para asuntos urgentes fuera del horario de oficina, no estamos disponibles las 24 horas del da, los 7 das de la semana.   Si tiene un problema urgente y no puede comunicarse con nosotros, puede optar por buscar atencin mdica  en el consultorio de su doctor(a), en una clnica privada, en un centro de atencin urgente o en una sala de emergencias.  Si tiene una emergencia mdica, por favor llame inmediatamente al 911 o vaya a la sala de emergencias.  Nmeros de bper  - Dr. Kowalski: [phone]  - Dra. Moye: [phone]  - Dra. Stewart: [phone]  En caso de inclemencias  del tiempo, por favor llame a nuestra lnea principal al [phone] para una actualizacin sobre el estado de cualquier retraso o cierre.  Consejos para la medicacin en dermatologa: Por favor, guarde las cajas en las que vienen los medicamentos de uso tpico para ayudarle a seguir las instrucciones sobre dnde y cmo usarlos. Las farmacias generalmente imprimen las instrucciones del medicamento slo en las cajas y no directamente en los tubos del medicamento.   Si su medicamento es muy caro, por favor, pngase en contacto con nuestra oficina llamando al [phone] y presione la opcin 4 o envenos un mensaje a travs de MyChart.   No podemos decirle cul ser su copago por los medicamentos por adelantado ya que esto es diferente dependiendo de la cobertura de su seguro. Sin embargo, es posible que podamos encontrar un medicamento sustituto a menor costo o llenar un formulario para que el seguro cubra el medicamento que se considera necesario.   Si se requiere una autorizacin previa para que su   cubra su medicamento, por favor permtanos de 1 a 2 das hbiles para completar este proceso.  Los precios de los medicamentos varan con frecuencia dependiendo del Environmental consultant de dnde se surte la receta y alguna farmacias pueden ofrecer precios ms baratos.  El sitio web www.goodrx.com tiene cupones para medicamentos de Airline pilot. Los precios aqu no tienen en cuenta lo que podra costar con la ayuda del seguro (puede ser ms barato con su seguro), pero el sitio web puede darle el precio si no utiliz Research scientist (physical sciences).  - Puede imprimir el cupn correspondiente y llevarlo con su receta a la farmacia.  - Tambin puede pasar por nuestra oficina durante el horario de atencin regular y Charity fundraiser una tarjeta de cupones de GoodRx.  - Si necesita que su receta se enve electrnicamente a una farmacia diferente, informe a nuestra oficina a travs de MyChart de [place] o por telfono llamando al  [phone] y presione la opcin 4.

## 2022-03-25 ENCOUNTER — Other Ambulatory Visit: Payer: Self-pay

## 2022-03-25 MED ORDER — AMZEEQ 4 % EX FOAM: CUTANEOUS | 2 refills | Status: DC

## 2022-04-01 ENCOUNTER — Encounter: Payer: Self-pay | Admitting: Dermatology

## 2022-07-23 ENCOUNTER — Encounter: Payer: Self-pay | Admitting: Physician Assistant

## 2022-07-25 ENCOUNTER — Ambulatory Visit: Payer: Medicaid Other | Admitting: Dermatology

## 2022-07-25 ENCOUNTER — Encounter: Payer: Self-pay | Admitting: Dermatology

## 2022-07-25 VITALS — BP 136/86 | HR 86

## 2022-07-25 DIAGNOSIS — L918 Other hypertrophic disorders of the skin: Secondary | ICD-10-CM

## 2022-07-25 DIAGNOSIS — L719 Rosacea, unspecified: Secondary | ICD-10-CM | POA: Diagnosis not present

## 2022-07-25 DIAGNOSIS — L700 Acne vulgaris: Secondary | ICD-10-CM | POA: Diagnosis not present

## 2022-07-25 DIAGNOSIS — D489 Neoplasm of uncertain behavior, unspecified: Secondary | ICD-10-CM

## 2022-07-25 NOTE — Progress Notes (Signed)
Follow-Up Visit   Subjective  Diana Bautista is a 37 y.o. female who presents for the following: 4 month acne follow up, currently taking/using spironolactone and tretinoin for treatment. Reports acne has improved on treatment and not experiencing any side effects.  Also reports some spots at left inner thigh that bother her, would like to discuss treatment  The following portions of the chart were reviewed this encounter and updated as appropriate: medications, allergies, medical history  Review of Systems:  No other skin or systemic complaints except as noted in HPI or Assessment and Plan.  Objective  Well appearing patient in no apparent distress; mood and affect are within normal limits.  Areas Examined: Face, chest and back, left inner thigh  Relevant exam findings are noted in the Assessment and Plan. left medial thigh anterior 0.6 cm flesh papule      left medial thigh posterior 0.7 cm flesh papule        Assessment & Plan   Neoplasm of uncertain behavior (2) left medial thigh anterior  Epidermal / dermal shaving  Lesion diameter (cm):  0.6 Informed consent: discussed and consent obtained   Timeout: patient name, date of birth, surgical site, and procedure verified   Patient was prepped and draped in usual sterile fashion: area prepped with isopropyl alcohol. Anesthesia: the lesion was anesthetized in a standard fashion   Anesthetic:  1% lidocaine w/ epinephrine 1-100,000 buffered w/ 8.4% NaHCO3 Instrument used: flexible razor blade   Hemostasis achieved with: aluminum chloride   Outcome: patient tolerated procedure well   Post-procedure details: wound care instructions given   Additional details:  Mupirocin and a bandage applied  Specimen 1 - Surgical pathology Differential Diagnosis: R/o irritated skin tag vs neurofibroma vs nevus   Check Margins: No  left medial thigh posterior  Epidermal / dermal shaving  Lesion diameter (cm):  0.7 Informed  consent: discussed and consent obtained   Timeout: patient name, date of birth, surgical site, and procedure verified   Patient was prepped and draped in usual sterile fashion: area prepped with isopropyl alcohol. Anesthesia: the lesion was anesthetized in a standard fashion   Anesthetic:  1% lidocaine w/ epinephrine 1-100,000 buffered w/ 8.4% NaHCO3 Instrument used: flexible razor blade   Hemostasis achieved with: aluminum chloride   Outcome: patient tolerated procedure well   Post-procedure details: wound care instructions given   Additional details:  Mupirocin and a bandage applied  Specimen 2 - Surgical pathology Differential Diagnosis: R/o irritated skin tag vs neurofibroma vs nevus   Check Margins: No  R/o irritated skin tag vs neurofibroma vs nevus   Acne vulgaris  Related Procedures Potassium  Related Medications tretinoin (RETIN-A) 0.025 % cream Apply a pea sized amount to the entire face QHS.  spironolactone (ALDACTONE) 100 MG tablet Take 1 tablet by mouth qhs   ACNE VULGARIS Exam:  Rare tiny inflammatory papule   Chronic and persistent condition with duration or expected duration over one year. Condition is bothersome/symptomatic for patient. Currently flared.   Treatment Plan:   Will recheck Potassium before increasing dose  Increase to 125 mg po qd from Spironolactone 100 mg once daily   Continue Tretinoin 0.025% cream at bedtime.   Spironolactone can cause increased urination and cause blood pressure to decrease. Please watch for signs of lightheadedness and be cautious when changing position. It can sometimes cause breast tenderness or an irregular period in premenopausal women. It can also increase potassium. The increase in potassium usually is not a  concern unless you are taking other medicines that also increase potassium, so please be sure your doctor knows all of the other medications you are taking. This medication should not be taken by pregnant  women.  This medicine should also not be taken together with sulfa drugs like Bactrim (trimethoprim/sulfamethexazole).    Topical retinoid medications like tretinoin/Retin-A, adapalene/Differin, tazarotene/Fabior, and Epiduo/Epiduo Forte can cause dryness and irritation when first started. Only apply a pea-sized amount to the entire affected area. Avoid applying it around the eyes, edges of mouth and creases at the nose. If you experience irritation, use a good moisturizer first and/or apply the medicine less often. If you are doing well with the medicine, you can increase how often you use it until you are applying every night. Be careful with sun protection while using this medication as it can make you sensitive to the sun. This medicine should not be used by pregnant women.   ROSACEA Exam  Mid face erythema with telangiectasias  Mid face erythema with telangiectasias   Rosacea is a chronic progressive skin condition usually affecting the face of adults, causing redness and/or acne bumps. It is treatable but not curable. It sometimes affects the eyes (ocular rosacea) as well. It may respond to topical and/or systemic medication and can flare with stress, sun exposure, alcohol, exercise, topical steroids (including hydrocortisone/cortisone 10) and some foods.  Daily application of broad spectrum spf 30+ sunscreen to face is recommended to reduce flares.  Treatment Plan Continue Rhofade as prescribed  Discussed to treat reds and browns Counseling for BBL / IPL / Laser and Coordination of Care Discussed the treatment option of Broad Band Light (BBL) /Intense Pulsed Light (IPL)/ Laser for skin discoloration, including brown spots and redness.  Typically we recommend at least 1-3 treatment sessions about 5-8 weeks apart for best results.  Cannot have tanned skin when BBL performed, and regular use of sunscreen is advised after the procedure to help maintain results. The patient's condition may also  require "maintenance treatments" in the future.  The fee for BBL / laser treatments is $350 per treatment session for the whole face.  A fee can be quoted for other parts of the body.  Insurance typically does not pay for BBL/laser treatments and therefore the fee is an out-of-pocket cost.   Return for 3 - 4 month acne .  I, Asher Muir, CMA, am acting as scribe for Darden Dates, MD.   Documentation: I have reviewed the above documentation for accuracy and completeness, and I agree with the above.  Darden Dates, MD

## 2022-07-25 NOTE — Patient Instructions (Addendum)
For Rosacea  Continue Rhofade  Recommend daily broad spectrum sunscreen SPF 30+ to sun-exposed areas, reapply every 2 hours as needed. Call for new or changing lesions.  Staying in the shade or wearing long sleeves, sun glasses (UVA+UVB protection) and wide brim hats (4-inch brim around the entire circumference of the hat) are also recommended for sun protection.   Recommend taking Heliocare sun protection supplement daily in sunny weather for additional sun protection. For maximum protection on the sunniest days, you can take up to 2 capsules of regular Heliocare OR take 1 capsule of Heliocare Ultra. For prolonged exposure (such as a full day in the sun), you can repeat your dose of the supplement 4 hours after your first dose. Heliocare can be purchased at [employer], at some [facility] or at [email or web address].     Recommend in less sunny months like fall / winter To treat red and brown spots Counseling for BBL / IPL / Laser and Coordination of Care Discussed the treatment option of Broad Band Light (BBL) /Intense Pulsed Light (IPL)/ Laser for skin discoloration, including brown spots and redness.  Typically we recommend at least 1-3 treatment sessions about 5-8 weeks apart for best results.  Cannot have tanned skin when BBL performed, and regular use of sunscreen is advised after the procedure to help maintain results. The patient's condition may also require "maintenance treatments" in the future.  The fee for BBL / laser treatments is $350 per treatment session for the whole face.  A fee can be quoted for other parts of the body.  Insurance typically does not pay for BBL/laser treatments and therefore the fee is an out-of-pocket cost.    For Acne   Can have potassium checked by primary in 3 weeks   Will increase spironolactone 100 mg tab to 125 mg tablet by mouth daily if normal labs.   Spironolactone can cause increased urination and cause blood pressure to decrease. Please  watch for signs of lightheadedness and be cautious when changing position. It can sometimes cause breast tenderness or an irregular period in premenopausal women. It can also increase potassium. The increase in potassium usually is not a concern unless you are taking other medicines that also increase potassium, so please be sure your doctor knows all of the other medications you are taking. This medication should not be taken by pregnant women.  This medicine should also not be taken together with sulfa drugs like Bactrim (trimethoprim/sulfamethexazole).   Continue tretinion 0.025 % cream - apply pea sized amount topically to face qhs as tolerated  Topical retinoid medications like tretinoin/Retin-A, adapalene/Differin, tazarotene/Fabior, and Epiduo/Epiduo Forte can cause dryness and irritation when first started. Only apply a pea-sized amount to the entire affected area. Avoid applying it around the eyes, edges of mouth and creases at the nose. If you experience irritation, use a good moisturizer first and/or apply the medicine less often. If you are doing well with the medicine, you can increase how often you use it until you are applying every night. Be careful with sun protection while using this medication as it can make you sensitive to the sun. This medicine should not be used by pregnant women.       Biopsy Wound Care Instructions  Leave the original bandage on for 24 hours if possible.  If the bandage becomes soaked or soiled before that time, it is OK to remove it and examine the wound.  A small amount of post-operative bleeding is normal.  If  excessive bleeding occurs, remove the bandage, place gauze over the site and apply continuous pressure (no peeking) over the area for 30 minutes. If this does not work, please call our clinic as soon as possible or page your doctor if it is after hours.   Once a day, cleanse the wound with soap and water. It is fine to shower. If a thick crust  develops you may use a Q-tip dipped into dilute hydrogen peroxide (mix 1:1 with water) to dissolve it.  Hydrogen peroxide can slow the healing process, so use it only as needed.    After washing, apply petroleum jelly (Vaseline) or an antibiotic ointment if your doctor prescribed one for you, followed by a bandage.    For best healing, the wound should be covered with a layer of ointment at all times. If you are not able to keep the area covered with a bandage to hold the ointment in place, this may mean re-applying the ointment several times a day.  Continue this wound care until the wound has healed and is no longer open.   Itching and mild discomfort is normal during the healing process. However, if you develop pain or severe itching, please call our office.   If you have any discomfort, you can take Tylenol (acetaminophen) or ibuprofen as directed on the bottle. (Please do not take these if you have an allergy to them or cannot take them for another reason).  Some redness, tenderness and white or yellow material in the wound is normal healing.  If the area becomes very sore and red, or develops a thick yellow-green material (pus), it may be infected; please notify us.    If you have stitches, return to clinic as directed to have the stitches removed. You will continue wound care for 2-3 days after the stitches are removed.   Wound healing continues for up to one year following surgery. It is not unusual to experience pain in the scar from time to time during the interval.  If the pain becomes severe or the scar thickens, you should notify the office.    A slight amount of redness in a scar is expected for the first six months.  After six months, the redness will fade and the scar will soften and fade.  The color difference becomes less noticeable with time.  If there are any problems, return for a post-op surgery check at your earliest convenience.  To improve the appearance of the scar, you  can use silicone scar gel, cream, or sheets (such as Mederma or Serica) every night for up to one year. These are available over the counter (without a prescription).  Please call our office at [phone] for any questions or concerns.     Due to recent changes in healthcare laws, you may see results of your pathology and/or laboratory studies on MyChart before the doctors have had a chance to review them. We understand that in some cases there may be results that are confusing or concerning to you. Please understand that not all results are received at the same time and often the doctors may need to interpret multiple results in order to provide you with the best plan of care or course of treatment. Therefore, we ask that you please give [place] 2 business days to thoroughly review all your results before contacting the office for clarification. Should we see a critical lab result, you will be contacted sooner.   If You Need Anything After Your Visit  If you have any questions or concerns for your doctor, please call our main line at [phone] and press option 4 to reach your doctor's medical assistant. If no one answers, please leave a voicemail as directed and we will return your call as soon as possible. Messages left after 4 pm will be answered the following business day.   You may also send [place] a message via MyChart. We typically respond to MyChart messages within 1-2 business days.  For prescription refills, please ask your pharmacy to contact our office. Our fax number is [phone].  If you have an urgent issue when the clinic is closed that cannot wait until the next business day, you can page your doctor at the number below.    Please note that while we do our best to be available for urgent issues outside of office hours, we are not available 24/7.   If you have an urgent issue and are unable to reach [place], you may choose to seek medical care at your doctor's office, retail clinic,  urgent care center, or emergency room.  If you have a medical emergency, please immediately call 911 or go to the emergency department.  Pager Numbers  - Dr. Gwen Pounds: [phone]  - Dr. Neale Burly: [phone]  - Dr. Roseanne Reno: [phone]  In the event of inclement weather, please call our main line at [phone] for an update on the status of any delays or closures.  Dermatology Medication Tips: Please keep the boxes that topical medications come in in order to help keep track of the instructions about where and how to use these. Pharmacies typically print the medication instructions only on the boxes and not directly on the medication tubes.   If your medication is too expensive, please contact our office at [phone] option 4 or send [place] a message through MyChart.   We are unable to tell what your co-pay for medications will be in advance as this is different depending on your insurance coverage. However, we may be able to find a substitute medication at lower cost or fill out paperwork to get insurance to cover a needed medication.   If a prior authorization is required to get your medication covered by your insurance company, please allow [place] 1-2 business days to complete this process.  Drug prices often vary depending on where the prescription is filled and some pharmacies may offer cheaper prices.  The website www.goodrx.com contains coupons for medications through different pharmacies. The prices here do not account for what the cost may be with help from insurance (it may be cheaper with your insurance), but the website can give you the price if you did not use any insurance.  - You can print the associated coupon and take it with your prescription to the pharmacy.  - You may also stop by our office during regular business hours and pick up a GoodRx coupon card.  - If you need your prescription sent electronically to a different pharmacy, notify our office through [facility] or by phone at [phone] option 4.     Si Usted Necesita Algo Despus de Su Visita  Tambin puede enviarnos un mensaje a travs de Clinical cytogeneticist. Por lo general respondemos a los mensajes de MyChart en el transcurso de 1 a 2 das hbiles.  Para renovar recetas, por favor pida a su farmacia que se ponga en contacto con nuestra oficina. Annie Sable de fax es [place] [phone].  Si tiene un asunto urgente cuando la clnica est  cerrada y que no puede esperar hasta el siguiente da hbil, puede llamar/localizar a su doctor(a) al nmero que aparece a continuacin.   Por favor, tenga en cuenta que aunque hacemos todo lo posible para estar disponibles para asuntos urgentes fuera del horario de [place], no estamos disponibles las 24 horas del da, los 7 [address]  [address] de la [place].   Si tiene un problema urgente y no puede comunicarse con nosotros, puede optar por buscar atencin mdica  en el consultorio de su doctor(a), en una clnica privada, en un centro de atencin urgente o en una sala de emergencias.  Si tiene Engineer, drilling, por favor llame inmediatamente al 911 o vaya a la sala de emergencias.  Nmeros de bper  - Dr. Gwen Pounds: [phone]  - Dra. Moye: [phone]  - Dra. Roseanne Reno: [phone]  En caso de inclemencias del [place], por favor llame a Lacy Duverney principal al [phone] para una actualizacin sobre el [place] de cualquier retraso o cierre.  Consejos para la medicacin en dermatologa: Por favor, guarde las cajas en las que vienen los medicamentos de uso tpico para ayudarle a seguir las instrucciones sobre dnde y cmo usarlos. Las farmacias generalmente imprimen las instrucciones del medicamento slo en las cajas y no directamente en los tubos del [place].   Si su medicamento es muy caro, por favor, pngase en contacto con Rolm Gala llamando al [phone] y presione la opcin 4 o envenos un mensaje a travs de Clinical cytogeneticist.   No podemos decirle cul  ser su copago por los medicamentos por adelantado ya que esto es diferente dependiendo de la cobertura de su seguro. Sin embargo, es posible que podamos encontrar un medicamento sustituto a Audiological scientist un formulario para que el seguro cubra el medicamento que se considera necesario.   Si se requiere una autorizacin previa para que su compaa de seguros [place] su medicamento, por favor permtanos de 1 a 2 das hbiles para completar [address]  Los precios de los medicamentos varan con frecuencia dependiendo del Environmental consultant de dnde se surte la receta y alguna farmacias pueden ofrecer precios ms baratos.  El sitio web www.goodrx.com tiene cupones para medicamentos de Health and safety inspector. Los precios aqu no tienen en cuenta lo que podra costar con la ayuda del seguro (puede ser ms barato con su seguro), pero el sitio web puede darle el precio si no utiliz Tourist information centre manager.  - Puede imprimir el cupn correspondiente y llevarlo con su receta a la farmacia.  - Tambin puede pasar por nuestra oficina durante el horario de atencin regular y Education officer, museum una tarjeta de cupones de GoodRx.  - Si necesita que su receta se enve electrnicamente a una farmacia diferente, informe a nuestra oficina a travs de MyChart de [place] o por telfono llamando al [phone] y presione la opcin 4.

## 2022-07-31 ENCOUNTER — Other Ambulatory Visit: Payer: Self-pay | Admitting: Dermatology

## 2022-07-31 ENCOUNTER — Telehealth: Payer: Self-pay

## 2022-07-31 DIAGNOSIS — L700 Acne vulgaris: Secondary | ICD-10-CM

## 2022-07-31 MED ORDER — TRETINOIN 0.025 % EX CREA: TOPICAL_CREAM | CUTANEOUS | 0 refills | Status: DC

## 2022-07-31 NOTE — Telephone Encounter (Signed)
Called patient. LMOVM results of Bx. C/B if any questions.

## 2022-07-31 NOTE — Telephone Encounter (Signed)
-----   Message from Sandi Mealy, MD sent at 07/31/2022  1:30 PM EDT ----- 1. Skin , left medial thigh anterior [place] 2. Skin , left medial thigh posterior [place]  Skin tag, benign, no additional treatment needed.    MAs please call. Thank you!

## 2022-09-06 DIAGNOSIS — R051 Acute cough: Secondary | ICD-10-CM | POA: Diagnosis not present

## 2022-09-06 DIAGNOSIS — J069 Acute upper respiratory infection, unspecified: Secondary | ICD-10-CM | POA: Diagnosis not present

## 2022-10-15 ENCOUNTER — Other Ambulatory Visit: Payer: Self-pay | Admitting: Dermatology

## 2022-10-15 DIAGNOSIS — L700 Acne vulgaris: Secondary | ICD-10-CM

## 2022-10-22 ENCOUNTER — Other Ambulatory Visit: Payer: Self-pay | Admitting: Physician Assistant

## 2022-10-22 DIAGNOSIS — Z3009 Encounter for other general counseling and advice on contraception: Secondary | ICD-10-CM

## 2022-11-07 ENCOUNTER — Ambulatory Visit: Payer: Medicaid Other | Admitting: Dermatology

## 2022-11-18 ENCOUNTER — Ambulatory Visit: Payer: Medicaid Other | Admitting: Dermatology

## 2023-03-20 ENCOUNTER — Other Ambulatory Visit: Payer: Self-pay | Admitting: Physician Assistant

## 2023-03-20 DIAGNOSIS — R002 Palpitations: Secondary | ICD-10-CM

## 2023-03-22 ENCOUNTER — Other Ambulatory Visit: Payer: Self-pay | Admitting: Dermatology

## 2023-03-22 DIAGNOSIS — L700 Acne vulgaris: Secondary | ICD-10-CM

## 2023-04-16 ENCOUNTER — Encounter: Payer: Self-pay | Admitting: Physician Assistant

## 2023-04-16 ENCOUNTER — Telehealth: Payer: Self-pay

## 2023-04-16 NOTE — Telephone Encounter (Signed)
 Patient called requesting a refill of Spironolactone , discussed with patient she will need to return to the office before any refills

## 2023-05-01 ENCOUNTER — Ambulatory Visit: Payer: Self-pay | Admitting: Physician Assistant

## 2023-05-01 NOTE — Telephone Encounter (Signed)
Will assess at visit tomorrow

## 2023-05-01 NOTE — Telephone Encounter (Signed)
 Copied from CRM [id number]. Topic: Clinical - Red Word Triage >> May 01, 2023  8:57 AM Elle L wrote: Red Word that prompted transfer to Nurse Triage: The patient's partner,  Shaun Cinson,  states that the patient has been having irregular heartbeats from anxiety from 7pm-2am. She is prescribed a beta blocker that she takes as needed and had to take the full amount last night but he states that patient is scared as it is the worst it has ever been.   Chief Complaint: increased anxiety due to "irregular heart rate" Symptoms: anxiety Frequency: ongoing 1.5 years Pertinent Negatives: Patient denies lightheadedness/dizziness, chest pain, shortness of breath  Disposition: [] ED /[] Urgent Care (no appt availability in office) / [x] Appointment(In office/virtual)/ []  [facility]/ [] Home Care/ [] Refused Recommended Disposition /[] [facility]/ []  Follow-up with PCP Additional Notes:  The patient's partner called on her behalf stating that last night, her heart rate was "irregular," he described it as tap tap tap pause tap tap tap tap tap pause.  The pause is inconsistent.  Her heart rate was not measured but her bp at time of irregular heart rate was 130/84.  The patient denied shortness of breath, chest pain, arm pain or lightheadedness.  Her anxiety increased due to the symptoms.  In the past she was assessed and it was concluded to be anxiety after wearing a heart monitor.  He stated that her anxiety is worse due to the irregular heart rate happening more frequently.  When it happens it usually goes away on its own with a minute or two.  If it does not go away on it's own, she takes propranolol.  She takes propranolol 10 mg up to three times daily as needed and the irregular heart beat resolves within 15-20 minutes. She took 3 doses last night and the prescription was expired.  The propranolol order is written as one capsule twice daily but the patient has been taking it differently because  she was she reported that she was told she could.  She is not currently having any symptoms.  She was up until about 2 am before being able to sleep.  She requested to see a different provider due to moving.  She was scheduled for a next day appointment.  Advised that the patient should not take anymore expired capsules because the stability of the capsules are unknown.  Discussed symptoms that would require immediate evaluation.  Scheduled for next day appointment at preferred office.  Reason for Disposition  Problems with anxiety or stress  Answer Assessment - Initial Assessment Questions 1. DESCRIPTION: "Please describe your heart rate or heartbeat that you are having" (e.g., fast/slow, regular/irregular, skipped or extra beats, "palpitations")     Skipped beats and high rate  2. ONSET: "When did it start?" (Minutes, hours or days)      Ongoing  3. DURATION: "How long does it last" (e.g., seconds, minutes, hours)   Normally goes away on it's own within a minute or two Takes propranolol as needed  15-20 minutes it stops  4. PATTERN "Does it come and go, or has it been constant since it started?"  "Does it get worse with exertion?"   "Are you feeling it now?"     Comes and goes  5. TAP: "Using your hand, can you tap out what you are feeling on a chair or table in front of you, so that I can hear?" (Note: not all patients can do this)  Tap Tap tap, pause, tap tap tap tap tap pause  6. HEART RATE: "Can you tell me your heart rate?" "How many beats in 15 seconds?"  (Note: not all patients can do this)       unknown 7. RECURRENT SYMPTOM: "Have you ever had this before?" If Yes, ask: "When was the last time?" and "What happened that time?"      Yes; monitor worn 1.5 years ago and said it was anxiety  8. CAUSE: "What do you think is causing the palpitations?"     Possibly due to anxiety  9. CARDIAC HISTORY: "Do you have any history of heart disease?" (e.g., heart attack, angina, bypass  surgery, angioplasty, arrhythmia)      None  10. OTHER SYMPTOMS: "Do you have any other symptoms?" (e.g., dizziness, chest pain, sweating, difficulty breathing)       Anxiety  11. PREGNANCY: "Is there any chance you are pregnant?" "When was your last menstrual period?"       No  Protocols used: Heart Rate and Heartbeat Questions-A-AH

## 2023-05-02 ENCOUNTER — Ambulatory Visit: Payer: Medicaid Other | Admitting: Family Medicine

## 2023-05-02 ENCOUNTER — Encounter: Payer: Self-pay | Admitting: Family Medicine

## 2023-05-02 ENCOUNTER — Ambulatory Visit: Payer: Medicaid Other | Attending: Family Medicine

## 2023-05-02 VITALS — BP 138/86 | HR 89 | Ht 63.0 in | Wt 286.7 lb

## 2023-05-02 DIAGNOSIS — R002 Palpitations: Secondary | ICD-10-CM | POA: Diagnosis not present

## 2023-05-02 MED ORDER — PROPRANOLOL HCL 10 MG PO TABS: 10.0000 mg | ORAL_TABLET | Freq: Two times a day (BID) | ORAL | 1 refills | Status: DC

## 2023-05-02 NOTE — Progress Notes (Signed)
 Acute visit   Patient: Diana Bautista   DOB: 1985-12-03   38 y.o. Female  MRN: [id number]  Chief Complaint  Patient presents with   Medication Refill    Propanalol   Anxiety    Pt reports increased anxiety.  She reports fair compliance with treatment. She reports good tolerance of treatment. She is not having side effects.  She feels her anxiety is moderate and Worse since last visit.  Symptoms: No chest pain No difficulty concentrating No dizziness No fatigue No feelings of losing control No insomnia No irritable Yes palpitations No panic attacks Yes racing thoughts No shortness of breath No sweating No tremors/shakes     Palpitations    Pt states feels like skipped heart beat intermittently    Immunizations    Influenza - unsure   Subjective    Discussed the use of AI scribe software for clinical note transcription with the patient, who gave verbal consent to proceed.  History of Present Illness   Diana Bautista, a patient with a history of palpitations attributed to anxiety, presents with an increase in the frequency of these episodes. The patient describes the palpitations as a sensation of a racing heart that can be calmed with deep breathing, and a feeling of skipped beats. These episodes are causing significant anxiety and concern for the patient. The palpitations occur most frequently in the evenings and are not associated with any physical discomfort or other symptoms. The patient reports that the palpitations can occur even during periods of rest, such as when reading a book. The patient is currently taking propranolol as needed for these episodes but reports that it does not seem to be very effective. The patient also takes spironolactone, prescribed by a dermatologist.           05/02/2023   11:14 AM 12/25/2021   11:11 AM 11/12/2018   10:50 AM  Depression screen PHQ 2/9  Decreased Interest 0 0 0  Down, Depressed, Hopeless 0 0 0  PHQ - 2 Score 0 0 0  Altered  sleeping  1 0  Tired, decreased energy  0 0  Change in appetite  1 0  Feeling bad or failure about yourself   1 0  Trouble concentrating  0 0  Moving slowly or fidgety/restless  0 0  Suicidal thoughts  0 0  PHQ-9 Score  3 0  Difficult doing work/chores  Somewhat difficult Not difficult at all       05/02/2023   11:14 AM 02/06/2022   11:59 AM 12/25/2021   11:09 AM  GAD 7 : Generalized Anxiety Score  Nervous, Anxious, on Edge 1 0 3  Control/stop worrying 2 0 3  Worry too much - different things 0 1 2  Trouble relaxing 0 0 1  Restless 0 0 0  Easily annoyed or irritable 0  0  Afraid - awful might happen 0 1 3  Total GAD 7 Score 3  12  Anxiety Difficulty Not difficult at all Not difficult at all Not difficult at all      Review of Systems   Objective    BP 138/86 (BP Location: Left Arm, Patient Position: Sitting, Cuff Size: Large)   Pulse 89   Ht 5\' 3"  (1.6 m)   Wt 286 lb 11.2 oz (130 kg)   SpO2 100%   BMI 50.79 kg/m   Physical Exam Vitals reviewed.  Constitutional:      General: She is not in acute distress.  Appearance: Normal appearance. She is well-developed. She is not diaphoretic.  HENT:     Head: Normocephalic and atraumatic.  Eyes:     General: No scleral icterus.    Conjunctiva/sclera: Conjunctivae normal.  Neck:     Thyroid: No thyromegaly.  Cardiovascular:     Rate and Rhythm: Normal rate and regular rhythm.     Heart sounds: Normal heart sounds. No murmur heard. Pulmonary:     Effort: Pulmonary effort is normal. No respiratory distress.     Breath sounds: Normal breath sounds. No wheezing, rhonchi or rales.  Musculoskeletal:     Cervical back: Neck supple.     Right lower leg: No edema.     Left lower leg: No edema.  Lymphadenopathy:     Cervical: No cervical adenopathy.  Skin:    General: Skin is warm and dry.     Findings: No rash.  Neurological:     Mental Status: She is alert and oriented to person, place, and time. Mental status is at  baseline.  Psychiatric:        Mood and Affect: Mood normal.        Behavior: Behavior normal.       No results found for any visits on 05/02/23.  Assessment & Plan     Problem List Items Addressed This Visit       Other   Palpitations - Primary   Relevant Medications   propranolol (INDERAL) 10 MG tablet   Other Relevant Orders   LONG TERM MONITOR (3-14 DAYS)   Comprehensive metabolic panel   CBC   TSH    Assessment and Plan    paroxysmal Supraventricular Tachycardia (pSVT) Intermittent episodes of palpitations, skipped beats, and occasional breathlessness, primarily in the evenings. Previous heart monitor in late 2023 showed variable heart rate with two episodes of SVT. Previous workup including electrolytes, kidney and liver function, blood counts, and thyroid function were normal. Propranolol as needed was ineffective due to its short-acting nature. Discussed the benign nature of SVT, its commonality, and lack of association with heart attacks. Patient expressed anxiety about palpitations and potential serious heart issues. Discussed repeating heart monitor and blood work to rule out underlying issues. Explained that a 14-day heart monitor captures >99% of irregular rhythms. - Order 14-day heart monitor - Order blood work including electrolytes, kidney and liver function, blood counts, and thyroid function - Continue propranolol as needed until results are back - Consider switching to a longer-acting beta blocker such as metoprolol or carvedilol based on results  General Health Maintenance Discussed the importance of managing risk factors for heart disease, including regular physical exams to monitor cholesterol, blood pressure, and other risk factors. Patient has not been consistent with yearly physicals and expressed anxiety about potential bad news from health check-ups. - Schedule a physical exam within the next three months - Encourage regular yearly physicals to monitor  and manage risk factors for heart disease  Follow-up - Follow up via MyChart with results of heart monitor and blood work - Discuss further management based on results, including potential switch to a longer-acting beta blocker.         Meds ordered this encounter  Medications   propranolol (INDERAL) 10 MG tablet    Sig: Take 1 tablet (10 mg total) by mouth 2 (two) times daily. TAKE 1 TABLET(10 MG) BY MOUTH TWICE DAILY    Dispense:  180 tablet    Refill:  1    **Patient requests 90 days  supply**     Return in about 3 months (around 07/30/2023) for CPE.      Shirlee Latch, MD  [facility] [phone] (phone) [phone] (fax)  [facility]

## 2023-05-05 ENCOUNTER — Encounter: Payer: Self-pay | Admitting: Family Medicine

## 2023-05-05 DIAGNOSIS — R002 Palpitations: Secondary | ICD-10-CM | POA: Diagnosis not present

## 2023-05-06 ENCOUNTER — Encounter: Payer: Self-pay | Admitting: Family Medicine

## 2023-05-06 LAB — CBC
Hematocrit: 39.6 % (ref 34.0–46.6)
Hemoglobin: 13 g/dL (ref 11.1–15.9)
MCH: 29.3 pg (ref 26.6–33.0)
MCHC: 32.8 g/dL (ref 31.5–35.7)
MCV: 89 fL (ref 79–97)
Platelets: 425 10*3/uL (ref 150–450)
RBC: 4.43 x10E6/uL (ref 3.77–5.28)
RDW: 12.5 % (ref 11.7–15.4)
WBC: 6.8 10*3/uL (ref 3.4–10.8)

## 2023-05-06 LAB — COMPREHENSIVE METABOLIC PANEL WITH GFR
ALT: 26 IU/L (ref 0–32)
AST: 17 IU/L (ref 0–40)
Albumin: 3.7 g/dL — ABNORMAL LOW (ref 3.9–4.9)
Alkaline Phosphatase: 69 IU/L (ref 44–121)
BUN/Creatinine Ratio: 12 (ref 9–23)
BUN: 10 mg/dL (ref 6–20)
Bilirubin Total: 0.3 mg/dL (ref 0.0–1.2)
CO2: 24 mmol/L (ref 20–29)
Calcium: 9.6 mg/dL (ref 8.7–10.2)
Chloride: 105 mmol/L (ref 96–106)
Creatinine, Ser: 0.84 mg/dL (ref 0.57–1.00)
Globulin, Total: 2.9 g/dL (ref 1.5–4.5)
Glucose: 94 mg/dL (ref 70–99)
Potassium: 4.4 mmol/L (ref 3.5–5.2)
Sodium: 142 mmol/L (ref 134–144)
Total Protein: 6.6 g/dL (ref 6.0–8.5)
eGFR: 91 mL/min/1.73 (ref >59)

## 2023-05-06 LAB — TSH: TSH: 4.23 u[IU]/mL (ref 0.450–4.500)

## 2023-05-08 ENCOUNTER — Encounter: Payer: Self-pay | Admitting: Family Medicine

## 2023-05-22 ENCOUNTER — Ambulatory Visit: Payer: Medicaid Other | Admitting: Dermatology

## 2023-05-27 DIAGNOSIS — R002 Palpitations: Secondary | ICD-10-CM | POA: Diagnosis not present

## 2023-06-05 ENCOUNTER — Encounter: Payer: Self-pay | Admitting: Family Medicine

## 2023-06-05 MED ORDER — METOPROLOL SUCCINATE ER 25 MG PO TB24: 25.0000 mg | ORAL_TABLET | Freq: Every day | ORAL | 3 refills | Status: DC

## 2023-06-10 DIAGNOSIS — H5213 Myopia, bilateral: Secondary | ICD-10-CM | POA: Diagnosis not present

## 2023-07-01 ENCOUNTER — Encounter: Payer: Self-pay | Admitting: Dermatology

## 2023-07-01 ENCOUNTER — Ambulatory Visit: Admitting: Dermatology

## 2023-07-01 DIAGNOSIS — L719 Rosacea, unspecified: Secondary | ICD-10-CM | POA: Diagnosis not present

## 2023-07-01 DIAGNOSIS — L814 Other melanin hyperpigmentation: Secondary | ICD-10-CM

## 2023-07-01 DIAGNOSIS — L578 Other skin changes due to chronic exposure to nonionizing radiation: Secondary | ICD-10-CM

## 2023-07-01 DIAGNOSIS — L700 Acne vulgaris: Secondary | ICD-10-CM

## 2023-07-01 DIAGNOSIS — S61211A Laceration without foreign body of left index finger without damage to nail, initial encounter: Secondary | ICD-10-CM | POA: Diagnosis not present

## 2023-07-01 MED ORDER — WINLEVI 1 % EX CREA
TOPICAL_CREAM | CUTANEOUS | 5 refills | Status: DC
Start: 2023-07-01 — End: 2023-12-24

## 2023-07-01 MED ORDER — TRETINOIN 0.025 % EX CREA: TOPICAL_CREAM | CUTANEOUS | 5 refills | Status: AC

## 2023-07-01 NOTE — Patient Instructions (Signed)

## 2023-07-01 NOTE — Progress Notes (Signed)
 Follow-Up Visit   Subjective  Diana Bautista is a 38 y.o. female who presents for the following: acne and rosacea. Patient currently using tretinoin  0.025 % 3-4 x weekly. Patient was taking spironolactone  100 mg daily with no side effects, used Winlevi  in the past. She has recently started taking metoprolol . Did not notice much improvement with Rhofade .   Patient asking about numbness at left 2nd finger after cutting it with a facial razor.   The patient has spots, moles and lesions to be evaluated, some may be new or changing and the patient may have concern these could be cancer.   The following portions of the chart were reviewed this encounter and updated as appropriate: medications, allergies, medical history  Review of Systems:  No other skin or systemic complaints except as noted in HPI or Assessment and Plan.  Objective  Well appearing patient in no apparent distress; mood and affect are within normal limits.   A focused examination was performed of the following areas: face  Relevant exam findings are noted in the Assessment and Plan.    Assessment & Plan   ACNE VULGARIS, hormonal component with chin affected  Exam: scattered few inflamed papules of forehead cheeks < chin  Chronic mildly flared not at goal  Treatment Plan: Restart Winlevi  twice daily to aa at jawline and chin. Samples x 3 given to patient.  Lot # [id number]  Exp: 06/2025 Do not recommend restarting spironolactone  since patient has started taking metoprolol .  Continue tretinoin  0.025% cr nightly as tolerated.   Topical retinoid medications like tretinoin /Retin-A , adapalene /Differin , tazarotene/Fabior, and Epiduo/Epiduo Forte can cause dryness and irritation when first started. Only apply a pea-sized amount to the entire affected area. Avoid applying it around the eyes, edges of mouth and creases at the nose. If you experience irritation, use a good moisturizer first and/or apply the medicine less often.  If you are doing well with the medicine, you can increase how often you use it until you are applying every night. Be careful with sun protection while using this medication as it can make you sensitive to the sun. This medicine should not be used by pregnant women.   ROSACEA Exam Mid face erythema with telangiectasias +/- scattered inflammatory papules  flared  Rosacea is a chronic progressive skin condition usually affecting the face of adults, causing redness and/or acne bumps. It is treatable but not curable. It sometimes affects the eyes (ocular rosacea) as well. It may respond to topical and/or systemic medication and can flare with stress, sun exposure, alcohol, exercise, topical steroids (including hydrocortisone /cortisone 10) and some foods.  Daily application of broad spectrum spf 30+ sunscreen to face is recommended to reduce flares.  Treatment Plan Sun protection  LENTIGINES Exam: scattered tan macules Due to sun exposure Treatment Plan: Benign-appearing, observe. Recommend daily broad spectrum sunscreen SPF 30+ to sun-exposed areas, reapply every 2 hours as needed.  Call for any changes Cosmetic dermatologist laser would be best treatment if desired  LACERATION Exam: crusted linear ulceration on lateral left 2nd finger, no bleeding discharge erythema  Treatment Plan: None May have injured sensory nerve. It may regrow and regain some or all sensation ACNE VULGARIS   Related Medications tretinoin  (RETIN-A ) 0.025 % cream APPLY A PEA SIZED AMOUNT TO ENTIRE FACE EVERY NIGHT AT BEDTIME ROSACEA   LENTIGINES   ACTINIC ELASTOSIS   LACERATION OF LEFT INDEX FINGER, FOREIGN BODY PRESENCE UNSPECIFIED, NAIL DAMAGE STATUS UNSPECIFIED, INITIAL ENCOUNTER    Return in about 1  year (around 06/30/2024) for acne.  Kerstin Peeling, RMA, am acting as scribe for Harris Liming, MD .   Documentation: I have reviewed the above documentation for accuracy and completeness, and  I agree with the above.  Harris Liming, MD

## 2023-09-17 ENCOUNTER — Other Ambulatory Visit: Payer: Self-pay | Admitting: Physician Assistant

## 2023-09-17 DIAGNOSIS — Z3009 Encounter for other general counseling and advice on contraception: Secondary | ICD-10-CM

## 2023-11-19 ENCOUNTER — Encounter: Payer: Self-pay | Admitting: Family Medicine

## 2023-11-26 ENCOUNTER — Encounter: Payer: Self-pay | Admitting: Family Medicine

## 2023-12-10 ENCOUNTER — Other Ambulatory Visit: Payer: Self-pay | Admitting: Family Medicine

## 2023-12-10 DIAGNOSIS — Z3009 Encounter for other general counseling and advice on contraception: Secondary | ICD-10-CM

## 2023-12-13 ENCOUNTER — Other Ambulatory Visit: Payer: Self-pay | Admitting: Family Medicine

## 2023-12-13 DIAGNOSIS — Z3009 Encounter for other general counseling and advice on contraception: Secondary | ICD-10-CM

## 2023-12-15 NOTE — Telephone Encounter (Signed)
 Appt made for 12/24/23 for a CPE.

## 2023-12-15 NOTE — Telephone Encounter (Signed)
 Requested Prescriptions  Refused Prescriptions Disp Refills   AUROVELA 24 FE 1-20 MG-MCG(24) tablet [Pharmacy Med Name: AUROVELA 24 FE 1/20 TABLETS] 84 tablet     Sig: TAKE 1 TABLET BY MOUTH DAILY     OB/GYN:  Contraceptives Failed - 12/15/2023  3:41 PM      Failed - Valid encounter within last 12 months    Recent Outpatient Visits           7 months ago Palpitations   [facility], Jon HERO, MD       Future Appointments             In 6 months Claudene Lehmann, MD [facility]            Passed - Last BP in normal range    BP Readings from Last 1 Encounters:  05/02/23 138/86         Passed - Patient is not a smoker

## 2023-12-24 ENCOUNTER — Ambulatory Visit (INDEPENDENT_AMBULATORY_CARE_PROVIDER_SITE_OTHER): Admitting: Family Medicine

## 2023-12-24 ENCOUNTER — Encounter: Payer: Self-pay | Admitting: Family Medicine

## 2023-12-24 VITALS — BP 151/92 | HR 88 | Temp 99.6°F | Ht 64.0 in | Wt 285.4 lb

## 2023-12-24 DIAGNOSIS — Z6841 Body Mass Index (BMI) 40.0 and over, adult: Secondary | ICD-10-CM | POA: Diagnosis not present

## 2023-12-24 DIAGNOSIS — G43809 Other migraine, not intractable, without status migrainosus: Secondary | ICD-10-CM

## 2023-12-24 DIAGNOSIS — K219 Gastro-esophageal reflux disease without esophagitis: Secondary | ICD-10-CM | POA: Diagnosis not present

## 2023-12-24 DIAGNOSIS — Z23 Encounter for immunization: Secondary | ICD-10-CM | POA: Diagnosis not present

## 2023-12-24 DIAGNOSIS — F419 Anxiety disorder, unspecified: Secondary | ICD-10-CM | POA: Diagnosis not present

## 2023-12-24 DIAGNOSIS — R030 Elevated blood-pressure reading, without diagnosis of hypertension: Secondary | ICD-10-CM | POA: Diagnosis not present

## 2023-12-24 DIAGNOSIS — J302 Other seasonal allergic rhinitis: Secondary | ICD-10-CM

## 2023-12-24 DIAGNOSIS — R002 Palpitations: Secondary | ICD-10-CM | POA: Diagnosis not present

## 2023-12-24 DIAGNOSIS — Z713 Dietary counseling and surveillance: Secondary | ICD-10-CM | POA: Diagnosis not present

## 2023-12-24 DIAGNOSIS — Z0001 Encounter for general adult medical examination with abnormal findings: Secondary | ICD-10-CM

## 2023-12-24 DIAGNOSIS — Z0000 Encounter for general adult medical examination without abnormal findings: Secondary | ICD-10-CM

## 2023-12-24 DIAGNOSIS — E782 Mixed hyperlipidemia: Secondary | ICD-10-CM | POA: Diagnosis not present

## 2023-12-24 DIAGNOSIS — Z3009 Encounter for other general counseling and advice on contraception: Secondary | ICD-10-CM

## 2023-12-24 DIAGNOSIS — Z79899 Other long term (current) drug therapy: Secondary | ICD-10-CM

## 2023-12-24 MED ORDER — SUMATRIPTAN SUCCINATE 50 MG PO TABS: ORAL_TABLET | ORAL | 1 refills | Status: AC

## 2023-12-24 MED ORDER — AUROVELA 24 FE 1-20 MG-MCG(24) PO TABS: 1.0000 | ORAL_TABLET | Freq: Every day | ORAL | 3 refills | Status: AC

## 2023-12-24 MED ORDER — METOPROLOL SUCCINATE ER 50 MG PO TB24: 50.0000 mg | ORAL_TABLET | Freq: Every day | ORAL | 3 refills | Status: AC

## 2023-12-24 NOTE — Progress Notes (Signed)
 Complete physical exam   Patient: Diana Bautista   DOB: Jul 13, 1985   38 y.o. Female  MRN: [id number] Visit Date: 12/24/2023  Today's healthcare provider: LAURAINE LOISE BUOY, DO   Chief Complaint  Patient presents with   Annual Exam    Diet- General, trying to eat well Exercise- yes Overall feeling- pretty good Sleep- Good Concerns- Metoprolol  wanting to see if the dosage could be increased.  Flu Vaccine-yes HPV Vaccine-yes Tdap Vaccine-yes   Subjective    Diana Bautista is a 38 y.o. female who presents today for a complete physical exam.   HPI HPI     Annual Exam    Additional comments: Diet- General, trying to eat well Exercise- yes Overall feeling- pretty good Sleep- Good Concerns- Metoprolol  wanting to see if the dosage could be increased.  Flu Vaccine-yes HPV Vaccine-yes Tdap Vaccine-yes      Last edited by Terrel Powell CROME, CMA on 12/24/2023 11:00 AM.       Diana Bautista is a 38 year old female with palpitations and hypertension who presents for her annual physical exam and for evaluation of palpitations and medication management.  She has experienced palpitations for several years and has previously worn a Zio heart monitor twice. Initially, she was on propranolol  10 mg, which was ineffective, and was switched to metoprolol , which she currently takes. Episodes of palpitations occur sporadically, with periods of weeks or months without symptoms, followed by a week or two of increased palpitations, elevating her anxiety. She has been stable for the past two weeks but notes that prior episodes have been distressing.  She has a history of white coat hypertension and monitors her blood pressure at home, typically reading around 110/85 mmHg, though it can rise to 130/85 mmHg when stressed. During her first Zio heart monitor test, she reports her blood pressure was notably high, prompting a two-week period of twice-daily monitoring, which returned normal results.  No issues with low blood pressure while on metoprolol  and no lightheadedness or dizziness, except for a recent 'swimmy head' sensation, which the patient noted has occurred for about a week and thought might be related to her stuffy nose and allergies.  She reports recent gastrointestinal symptoms, including bloating and reflux over the past month. She takes famotidine  20 mg daily to manage these symptoms, which she finds effective. She reports some stomach issues, including bloating and reflux, but did not report nausea or vomiting.  She experiences anxiety, which she feels is exacerbated by her palpitations. She scored low on anxiety screening questions but acknowledges that she may downplay her symptoms. She does not consume alcohol and has not missed any doses of her birth control.  She has a history of ear issues as a child and currently experiences some allergy symptoms, including nasal congestion and a 'swimmy head' sensation. She occasionally takes Zyrtec or uses a nasal spray for congestion.  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 She tries to maintain a healthy diet and engages in physical activity, including walking and using a home gym, though she admits she could increase her activity level. She has lost five pounds in the past two weeks and is exploring options for weight management.     Past Medical History:  Diagnosis Date   Ectopic pregnancy 2013   Past Surgical History:  Procedure Laterality  Date   left salpingectomy  2013   Ectopic pregnancy   MYRINGOTOMY WITH TUBE PLACEMENT     WISDOM TOOTH EXTRACTION     Social History   Socioeconomic History   Marital status: Single    Spouse name: Not on file   Number of children: Not on file   Years of education: Not on file   Highest education level: Not on file  Occupational History    Not on file  Tobacco Use   Smoking status: Never   Smokeless tobacco: Never  Vaping Use   Vaping status: Never Used  Substance and Sexual Activity   Alcohol use: No   Drug use: No   Sexual activity: Yes    Partners: Male    Birth control/protection: I.U.D.    Comment: Lyletta  Other Topics Concern   Not on file  Social History Narrative   Not on file   Social Drivers of Health   Financial Resource Strain: Low Risk  (12/24/2023)   Overall Financial Resource Strain (CARDIA)    Difficulty of Paying Living Expenses: Not hard at all  Food Insecurity: No Food Insecurity (12/24/2023)   Hunger Vital Sign    Worried About Running Out of Food in the Last Year: Never true    Ran Out of Food in the Last Year: Never true  Transportation Needs: No Transportation Needs (12/24/2023)   PRAPARE - Administrator, Civil Service (Medical): No    Lack of Transportation (Non-Medical): No  Physical Activity: Sufficiently Active (01/15/2017)   Exercise Vital Sign    Days of Exercise per Week: 7 days    Minutes of Exercise per Session: 30 min  Stress: No Stress Concern Present (12/24/2023)   [employer] of Occupational Health - Occupational Stress Questionnaire    Feeling of Stress: Not at all  Social Connections: Moderately Isolated (01/15/2017)   Social Connection and Isolation Panel    Frequency of Communication with Friends and Family: More than three times a week    Frequency of Social Gatherings with Friends and Family: Three times a week    Attends Religious Services: Never    Active Member of Clubs or Organizations: No    Attends Banker Meetings: Never    Marital Status: Never married  Intimate Partner Violence: Not At Risk (12/24/2023)   Humiliation, Afraid, Rape, and Kick questionnaire    Fear of Current or Ex-Partner: No    Emotionally Abused: No    Physically Abused: No    Sexually Abused: No   Family Status  Relation Name Status   PGM  Alive    Mother  Alive   Father  Alive   MGM  Deceased   MGF  Alive   Son  (Not Specified)   PGF  Deceased   Neg Hx  (Not Specified)  No partnership data on file   Family History  Problem Relation Age of Onset   Hodgkin's lymphoma Paternal Grandmother    GER disease Mother    Hypertension Mother    COPD Father    Cerebrovascular Accident Maternal Grandmother    Heart disease Maternal Grandmother    Heart disease Maternal Grandfather    Autism Son    Breast cancer Neg Hx    Ovarian cancer Neg Hx    Allergies  Allergen Reactions   Penicillins Hives    Patient Care Team: Georgene Kopper, Lauraine SAILOR, DO as PCP - General (Family Medicine)   Medications: Outpatient Medications Prior to  Visit  Medication Sig   famotidine  (PEPCID ) 20 MG tablet Take 20 mg by mouth daily.   tretinoin  (RETIN-A ) 0.025 % cream APPLY A PEA SIZED AMOUNT TO ENTIRE FACE EVERY NIGHT AT BEDTIME   [DISCONTINUED] metoprolol  succinate (TOPROL -XL) 25 MG 24 hr tablet Take 1 tablet (25 mg total) by mouth daily.   [DISCONTINUED] Norethindrone Acetate-Ethinyl Estrad-FE (AUROVELA 24 FE) 1-20 MG-MCG(24) tablet Take 1 tablet by mouth daily. NEED APPOINTMENT. 28-day courtesy fill only.   [DISCONTINUED] SUMAtriptan  (IMITREX ) 50 MG tablet Take 1 tablet (50 mg total) by mouth once for 1 dose. May repeat in 2 hours if headache persists or recurs. (Patient taking differently: Take 50 mg by mouth as needed for migraine. May repeat in 2 hours if headache persists or recurs.)   [DISCONTINUED] Clascoterone  (WINLEVI ) 1 % CREA Apply to aa face bid   No facility-administered medications prior to visit.    Review of Systems  Constitutional:  Negative for chills, fatigue and fever.  HENT:  Positive for congestion (mild). Negative for ear pain, rhinorrhea, sneezing and sore throat.   Eyes: Negative.  Negative for pain and redness.  Respiratory:  Negative for cough, shortness of breath and wheezing.   Cardiovascular:  Negative for chest pain and leg  swelling.  Gastrointestinal:  Negative for abdominal pain, blood in stool, constipation, diarrhea and nausea.  Endocrine: Negative for polydipsia and polyphagia.  Genitourinary: Negative.  Negative for dysuria, flank pain, hematuria, pelvic pain, vaginal bleeding and vaginal discharge.  Musculoskeletal:  Negative for arthralgias, back pain, gait problem and joint swelling.  Skin:  Negative for rash.  Neurological: Negative.  Negative for dizziness, tremors, seizures, weakness, light-headedness, numbness and headaches.  Hematological:  Negative for adenopathy.  Psychiatric/Behavioral: Negative.  Negative for behavioral problems, confusion and dysphoric mood. The patient is not nervous/anxious and is not hyperactive.       Objective    BP (!) 151/92 (BP Location: Right Arm, Patient Position: Sitting, Cuff Size: Large)   Pulse 88   Temp 99.6 F (37.6 C) (Oral)   Ht 5' 4 (1.626 m)   Wt 285 lb 6.4 oz (129.5 kg)   SpO2 99%   BMI 48.99 kg/m    Physical Exam Vitals and nursing note reviewed.  Constitutional:      General: She is awake.     Appearance: Normal appearance. She is morbidly obese.  HENT:     Head: Normocephalic and atraumatic.     Right Ear: Ear canal and external ear normal. A middle ear effusion (Clear) is present. Tympanic membrane is not retracted or bulging.     Left Ear: Ear canal and external ear normal. A middle ear effusion (Clear) is present. Tympanic membrane is not retracted or bulging.     Nose: Nose normal.     Mouth/Throat:     Mouth: Mucous membranes are moist.     Pharynx: Oropharynx is clear. No oropharyngeal exudate or posterior oropharyngeal erythema.  Eyes:     General: No scleral icterus.    Extraocular Movements: Extraocular movements intact.     Conjunctiva/sclera: Conjunctivae normal.     Pupils: Pupils are equal, round, and reactive to light.  Neck:     Thyroid : No thyromegaly or thyroid  tenderness.  Cardiovascular:     Rate and Rhythm:  Normal rate and regular rhythm.     Pulses: Normal pulses.     Heart sounds: Normal heart sounds.  Pulmonary:     Effort: Pulmonary effort is normal. No tachypnea, bradypnea  or respiratory distress.     Breath sounds: Normal breath sounds. No stridor. No wheezing, rhonchi or rales.  Abdominal:     General: Bowel sounds are normal. There is no distension.     Palpations: Abdomen is soft. There is no mass.     Tenderness: There is no abdominal tenderness. There is no guarding.     Hernia: No hernia is present.  Musculoskeletal:     Cervical back: Normal range of motion and neck supple.     Right lower leg: No edema.     Left lower leg: No edema.  Lymphadenopathy:     Cervical: No cervical adenopathy.  Skin:    General: Skin is warm and dry.  Neurological:     Mental Status: She is alert and oriented to person, place, and time. Mental status is at baseline.  Psychiatric:        Mood and Affect: Mood normal.        Behavior: Behavior normal.      Last depression screening scores    12/24/2023   11:02 AM 05/02/2023   11:14 AM 12/25/2021   11:11 AM  PHQ 2/9 Scores  PHQ - 2 Score 0 0 0  PHQ- 9 Score 1  3   Last fall risk screening    12/24/2023   11:02 AM  Fall Risk   Falls in the past year? 0  Number falls in past yr: 0  Injury with Fall? 0   Last Audit-C alcohol use screening    12/24/2023   11:33 AM  Alcohol Use Disorder Test (AUDIT)  1. How often do you have a drink containing alcohol? 0  2. How many drinks containing alcohol do you have on a typical day when you are drinking? 0  3. How often do you have six or more drinks on one occasion? 0  AUDIT-C Score 0   A score of 3 or more in women, and 4 or more in men indicates increased risk for alcohol abuse, EXCEPT if all of the points are from question 1   No results found for any visits on 12/24/23.  Assessment & Plan    Routine Health Maintenance and Physical Exam  Exercise Activities and Dietary  recommendations  Goals   None     Immunization History  Administered Date(s) Administered   HPV 9-valent 12/24/2023   Hep B, Unspecified 12/22/1995, 01/26/1996, 07/05/1996   Influenza, Seasonal, Injecte, Preservative Fre 12/24/2023   Influenza,inj,Quad PF,6+ Mos 12/25/2021   PFIZER(Purple Top)SARS-COV-2 Vaccination 06/01/2019, 06/22/2019, 02/17/2020   Tdap 12/24/2023    Health Maintenance  Topic Date Due   HPV VACCINES (2 - 3-dose SCDM series) 01/21/2024   COVID-19 Vaccine (4 - 2025-26 season) 11/09/2024 (Originally 11/10/2023)   Cervical Cancer Screening (HPV/Pap Cotest)  06/15/2024   DTaP/Tdap/Td (2 - Td or Tdap) 12/23/2033   Influenza Vaccine  Completed   Hepatitis C Screening  Completed   HIV Screening  Completed   Pneumococcal Vaccine  Aged Out   Meningococcal B Vaccine  Aged Out    Discussed health benefits of physical activity, and encouraged her to engage in regular exercise appropriate for her age and condition.   Annual physical exam  Palpitations -     Metoprolol  Succinate ER; Take 1 tablet (50 mg total) by mouth daily. Take with or immediately following a meal.  Dispense: 90 tablet; Refill: 3  Anxiety  White coat syndrome without hypertension  Morbid obesity with BMI of 45.0-49.9, adult (HCC)  Weight loss counseling, encounter for  Gastroesophageal reflux disease, unspecified whether esophagitis present -     Vitamin B12  High risk medication use -     Vitamin B12  Moderate mixed hyperlipidemia not requiring statin therapy -     Lipid panel  Seasonal allergic rhinitis, unspecified trigger  Other migraine without status migrainosus, not intractable -     SUMAtriptan  Succinate; Take 1 tablet (50 mg total) by mouth daily as needed for 1 dose. May repeat x1 in 2 hours if headache persists or recurs  Dispense: 12 tablet; Refill: 1  Encounter for counseling regarding contraception -     Aurovela 24 FE; Take 1 tablet by mouth daily. NEED APPOINTMENT.  28-day courtesy fill only.  Dispense: 84 tablet; Refill: 3  Need for influenza vaccination -     Flu vaccine trivalent PF, 6mos and older(Flulaval,Afluria,Fluarix,Fluzone)  Need for Tdap vaccination -     Tdap vaccine greater than or equal to 7yo IM  Need for HPV vaccination -     HPV 9-valent vaccine,Recombinat      Annual physical exam Physical exam overall unremarkable except as noted above. Routine lab work ordered as noted.  Discussed COVID booster; patient declines today. No recent cholesterol panel available.  Pap smear up-to-date; due for repeat screening 06/15/2024. - Order lipid panel and vitamin B12. - Administer first dose of HPV vaccine today and plan for the second dose in 1-2 months.  Palpitations; anxiety; white coat syndrome without hypertension Intermittent palpitations linked to anxiety. Blood pressure elevated in clinical settings, normal at home. No hypotension or significant side effects from metoprolol . - Increase metoprolol  to 50 mg extended release daily. - Monitor blood pressure at home and bring cuff to next appointment for comparison. - Follow up in 3-4 weeks to reassess blood pressure and palpitations.  Morbid obesity with BMI of 45.0-49.9, adult; weight loss counseling, encounter for Recent weight loss of 5 pounds.  Discussed weight loss medication options; patient will continue with lifestyle modifications alone. - Monitor caloric intake, aiming for 2000-2500 calories per day for gradual weight loss. - Increase physical activity as feasible.  Plan to exercise at least 150 minutes of moderate intensity physical activity per week.  Gastroesophageal Reflux Disease (GERD), unspecified whether esophagitis present Symptoms controlled with famotidine  20 mg daily. - Check vitamin B12 levels due to daily famotidine  use.  Moderate mixed hyperlipidemia not requiring statin therapy Recheck lipid panel today.  Continue focus on lifestyle modifications as noted  above.  Seasonal allergic Rhinitis, unspecified trigger Mild symptoms likely due to allergies, including nasal congestion and fluid in ears. - Take Zyrtec daily for the next week or two to manage allergy symptoms. - Consider using nasal spray if congestion persists.  Other migraine, without status migrainosus, not intractable Infrequent migraines, sumatriptan  available but unused in past year and now expired.  Refill sumatriptan  today.  Encounter for counseling regarding contraception Patient reports consistent adherence to oral contraceptive.  She has not missed any recent pills.  Refill Aurovela OCP today with 1 year prescription.     Return in about 1 month (around 01/24/2024) for BP/palp, HPV vaccine.     I discussed the assessment and treatment plan with the patient  The patient was provided an opportunity to ask questions and all were answered. The patient agreed with the plan and demonstrated an understanding of the instructions.   The patient was advised to call back or seek an in-person evaluation if the symptoms worsen or if the  condition fails to improve as anticipated.    LAURAINE LOISE BUOY, DO  [facility] [phone] (phone) [phone] (fax)  [facility]

## 2023-12-24 NOTE — Patient Instructions (Addendum)
  Check your blood pressure once daily, and any time you have concerning symptoms like headache, chest pain, dizziness, shortness of breath, or vision changes.   Our goal is less than 130/80.  To appropriately check your blood pressure, make sure you do the following:  1) Avoid caffeine, exercise, or tobacco products for 30 minutes before checking. Empty your bladder. 2) Sit with your back supported in a flat-backed chair. Rest your arm on something flat (arm of the chair, table, etc). 3) Sit still with your feet flat on the floor, resting, for at least 5 minutes.  4) Check your blood pressure. Take 1-2 readings.  5) Write down these readings and bring with you to any provider appointments.  Bring your home blood pressure machine with you to a provider's office for accuracy comparison at least once a year.   Make sure you take your blood pressure medications before you come to any office visit, even if you were asked to fast for labs.  __________________________________________________   Increase your intake of fresh/frozen fruits and nonstarchy vegetables.  The Mediterranean diet is a great reference for meal ideas.  I strongly encourage you to incorporate exercise into your daily routine (at least 30 minutes most days of the week) and monitor your dietary intake. - I encourage you to measure/weigh your foods/beverages for a few days to get a more accurate idea of what different amounts of things look like on your plate or in your glass.  - Using smaller plates/glasses also helps in that it tricks our minds into thinking we've eaten more than we have. Studies have shown that we eat more when presented with larger plates, even with the same amount of food on them.   - Plan to eat until you are no longer hungry, rather than until you're full.  If you don't normally exercise, start with something simple or more enjoyable. You can plan to walk for your half hour or do something like dancing if  you enjoy it.  Build up your activity over time; this will make it more enjoyable and reduce your risk of injury.  Here are some videos which offer a variety of different workout classes with different levels: https://[email or web address].com/session/203  It is completely free. If a full video is too much for you to do, you can do them in bite-sized chunks (just pausing when needed and resuming later in the day).   Even if these actions don't ultimately lead to weight loss, increasing your activity will still help to reduce your insulin resistance and will improve your cardiovascular health (making heart attack/stroke less likely as you age).

## 2023-12-31 ENCOUNTER — Encounter: Payer: Self-pay | Admitting: Family Medicine

## 2024-01-05 ENCOUNTER — Encounter: Payer: Self-pay | Admitting: Family Medicine

## 2024-01-12 ENCOUNTER — Other Ambulatory Visit: Payer: Self-pay | Admitting: Family Medicine

## 2024-01-12 DIAGNOSIS — Z3009 Encounter for other general counseling and advice on contraception: Secondary | ICD-10-CM

## 2024-07-01 ENCOUNTER — Ambulatory Visit: Admitting: Dermatology
